# Patient Record
Sex: Female | Born: 1970 | Hispanic: No | Marital: Single
Health system: Northeastern US, Academic
[De-identification: ages and names within clinical notes are randomized; demographics above are authoritative.]

---

## 2016-11-14 ENCOUNTER — Ambulatory Visit

## 2016-12-05 ENCOUNTER — Ambulatory Visit: Admit: 2016-12-05 | Payer: No Typology Code available for payment source

## 2016-12-05 ENCOUNTER — Ambulatory Visit: Admitting: Urology

## 2016-12-05 NOTE — Progress Notes (Signed)
.  Progress Notes  .  Patient: Emily Ford  Provider: [name]    .  DOB: Jan 23, 1971 Age: 46 Y Sex: Female  .  PCP: Adah Salvage MD  Date: 12/05/2016  .  --------------------------------------------------------------------------------  .  REASON FOR APPOINTMENT  .  1. Right xanthogranulomatous pyelonephritis  .  2. Staghorn calculus  .  3. Severe hydronephrosis  .  HISTORY OF PRESENT ILLNESS  .  Adult Urology:   This is a 45 year old female with a complex medical history  including, but not limited to anxiety, depression, arthritis,  back problem, migraine, eating disorder, kidney stones,  hypokalemia, otitis media, acute pharyngitis, disorder of kidney  and ureter, UTIs, dysmenorrhea, contact dermatitis, shoulder  joint pain, neck pain, backache, DSTYK, paresthesia, edema,  dyspnea, abdominal pain, right hydronephrosis, and staghorn  calculus, who is here at the request of Dr. Nestor Lewandowsky  for Dr. Bonne Dolores opinion regarding right xanthogranulomatous  pyelonephritis..Ms. 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 revealed there is relatively  decreased right renal uptake. Right kidney takes up approximately  22.3 % of the administered radiopharmaceutical and left kidney  takes up approximately 77.7 %. Evidence of right renal  obstruction..Ms. Youngman also has a history of a staghorn  calculus. Patient did not bring a disc of the images.She voids  with good stream and good control..She has no other complaints.  Patient denies gross hematuria, urethral discharge, pyuria,  dysuria, UTI, fever, chills, night sweats, weight loss and  skeletal pain..  .  CURRENT MEDICATIONS  .  Taking Alprazolam 2 MG Tablet 1 tablet Orally Twice a day  Taking Cephalexin 500 mg Tablet 2 tablet Orally every 12 hrs  Taking Ferrous Sulfate 325 (65 Fe) MG  Tablet 1 tablet Orally Once  a day  Taking Folic Acid 1 MG Tablet 1 tablet Orally Once a day  Taking Gabapentin 400 MG Capsule 1 capsule Orally Twice a day  Taking Milk of Magnesia 400 MG/5ML Suspension 5 ml as needed  Orally Four times a day  Taking Narcan 4 MG/0.1ML Liquid Nasally  Taking Ocean Nasal Spray 0.65 % Solution 2 sprays in each nostril  as needed Nasally every 2 hrs  Taking Pantoprazole Sodium 40 MG Tablet Delayed Release 1 tablet  Orally Once a day  Taking Paroxetine HCl 30 MG Tablet 1 tablet in the morning Orally  Once a day  Taking Polyethylene Glycol 3350 - Powder  Taking Senna 8.6 MG Tablet 2 tablets at bedtime as needed Orally  Once a day  Taking Tamsulosin HCl 0.4 MG Capsule 1 capsule Orally Once a day  Taking Trazodone HCl 50 MG Tablet 1 tablet at bedtime as needed  Orally Once a day  Taking Tums (calcium carbonate) 200 MG twice daily  Medication List reviewed and reconciled with the patient  .  PAST MEDICAL HISTORY  .  Anxiety  Depression  Arthritis  Back problem  Migraine  Eating disorder  Kidney stones  Hypokalemia  Otitis media  Acute pharyngitis  Disorder of kidney and ureter  Urinary tract infections (UTIs)  Dysmenorrhea  Contact dermatitis  Shoulder joint pain  Neck pain  Backache  DSTYK  Parethesia  Edema  Dyspnea  Abdominal pain  Pyelonephritis  Right hydronephrosis  Staghorn calculus  Sepsis  .  ALLERGIES  .  Toradol  .  SURGICAL HISTORY  .  Appendectomy 1986  Casearean section 1989  Ectopic pregnancy surgery 2001  .  FAMILY HISTORY  .  Mother: deceased  Father: alive, diagnosed with Heart Disease  2 brother(s) , 2 sister(s) . 2 son(s) , 4 daughter(s) .  Mother died of scolerosis of the liver in 47. Diabetes paternal  grandmother.  .  SOCIAL HISTORY  .  .  Tobaccohistory:Currently smoking Pack Year History: Started at 13  years until 1/2 pack per year .  [name]  Caffeine: 4 cups of coffee.  .  Lives with: 6 kids and grandchildren.  [name]  HOSPITALIZATION/MAJOR DIAGNOSTIC PROCEDURE  .  Sepsis  ( August-November-December- February) 2017-2018  .  REVIEW OF SYSTEMS  .  Urology ROS:  .  Constitutional:    No fevers/chills/weight loss or general  weakness . Eyes:    No decreased visual acuity, loss of vision or  diplopia . Integumentary:    No rash or skin lesions, No changes  in hair or nail character . Lungs:    no shortness of breath, new  frequent cough, no history of asthma . Cardiovascular:    No  chest pain, palpitations . Gastrointestinal:    No abdominal  pain/nausea/vomiting/bowel changes . Genitourinary:    No  urethral discharge or dysuria . Musculoskeletal:    No  joint\muscle pain, decreased mobility or joint swelling .  Neurological:    No headache, dizziness, seizures, light  headedness, memory loss or numbness . Psychiatry:    No  mood/behavioral changes, anxiety or depression . Endocrine:    No  hirsutism or excessive hair loss, polyuria, polydipsia or  alopecia . Hematologic/Lymphatic:    No lymphadenopathy, easy  bruising or abnormal bleeding . Allergic/Immunologic:    No  environmental or food allergies .  [name]  VITAL SIGNS  .  Pain scale 8, Ht-in 61.50, Wt-lbs 154.6, BMI 28.74, BP 135/78, HR  120, BSA 1.74, Ht-cm 156.21, Wt-kg 70.13.  [name]  PHYSICAL EXAMINATION  .  Urology PE:  General Appearance:  well-developed, well nourished, NAD, normal  secondary sexual characteristics. HEENT:  normocephalic,  atraumatic, PERRLA, EOMI, anicteric, no nasal discharge, sinuses  non-tender, oral pharynx within normal limits. Skin:  no bruises,  no petechiae, no rashes or lesions. Neck:  supple, no masses,  trachea in midline, normal range of motion. Chest  normal AP  diameter, no rib tenderness. Lungs:  normal respirations,  symmetric excursion with no accesory muscle use. Back:  no CVAT,  vertebral column aligned, no sacral  or dimples, no  scoliosis/kyphosis, masses or tenderness. Cardiovascular:  RRR,  no murmur, pulses intact. Abdomen:  Bruised abdominal wall, Right  anterior abdominal wall with  previous abscess drainage sites  dressings dry and clean, Abdomen globular obese, soft, benign,  non-tender, non-distended, no palpable masses, no  hepato-splenomegaly, no hernias, bladder not palpable.  Extremities:  no clubbing, cyanosis or pitting edema.  Musculo-Skeletal:  no muscle wasting, joint swelling or  tenderness. Lymphatic:  no cervical, axillary or inguinal  adenopathy. Neurologic:  oriented x3, no focal deficits.  GU Female:  Genitalia:  Normally developed female genitalia. Pelvic exam:   Deferred as per patient request.  .  ASSESSMENTS  .  Staghorn calculus - N20.0 (Primary), Long discussion regarding  management of Staghorn calculus  .  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  Hydronephrosis, right - N13.30, Secondary to Staghorn calculus  .  TREATMENT  .  Staghorn calculus  Notes: Advised to bring all discs of previous radiographic  studies from OSH and other medical records that are missing in  order to provide complete assessment of her condition.  .  .  Emphysematous pyelonephritis of right kidney  LAB: Urine Culture  Urine Culture     See Below For Report     ( - )  .  [name]  LAB: Urine Dip POC  1 Blood, Negative for LE, and NIT.  Notes: Long discussion regarding plan of management.  .  .  Others  Notes: RTC 12-19-2016 with discs of all previous radiographic  studies for coordination of care.Due to her complex medical  history as detailed above, Dr Dorothea Glassman spent 55 minutes with  patient reviewing her medical history and for management,  treatment, counselling, and coordination of care for her  condition. 50% of this time was counselling the patient.  [name]  PREVENTIVE MEDICINE  .  Counseling:  Smoking   . BMI Management   .  [name]  PROCEDURE CODES  .  7532 URO MD URINALYSIS AUTO W/O MICROSCOPY  .  FOLLOW UP  .  RTC 12-19-2016.  [name]  Electronically signed by [name] , MD on  12/13/2016 at 09:33 AM EDT  .  Document electronically signed by  [name]    .

## 2016-12-05 NOTE — Progress Notes (Signed)
* * *        Emily Ford**    --- ---    79 Y old Female, DOB: 21-Feb-1971, External MRN: [id number]    Account Number: [id number]    [address], [place], [place]    Home: [phone]    Insurance: [id number] [employer] [facility]    PCP: Emily Salvage, MD Referring: Emily Salvage, MD    Appointment Facility: [facility]        * * *    12/05/2016  Progress Notes: Emily Salvo, MD **CHN#:** [id number]    --- ---    ---        Reason for Appointment    ---      1\. Right xanthogranulomatous pyelonephritis    ---    2\. Staghorn calculus    ---    3\. Severe hydronephrosis    ---      History of Present Illness    ---     _Adult Urology_ :    This is a 46 year old female with a complex medical history including, but not  limited to anxiety, depression, arthritis, back problem, migraine, eating  disorder, kidney stones, hypokalemia, otitis media, acute pharyngitis,  disorder of kidney and ureter, UTIs, dysmenorrhea, contact dermatitis,  shoulder joint pain, neck pain, backache, DSTYK, paresthesia, edema, dyspnea,  abdominal pain, right hydronephrosis, and staghorn calculus, who is here at  the request of Emily Ford for Emily Ford opinion regarding  right xanthogranulomatous pyelonephritis.    .    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    She also has a history of right hydronephrosis.    [name]    NM Kidney in 03/2016 (Report Only) reviewed by [name] revealed there is  relatively decreased right renal uptake. Right kidney takes up approximately  22.3 % of the administered radiopharmaceutical and left kidney takes up  approximately 77.7 %. Evidence of right renal obstruction.    .    Emily Ford also has a history of a staghorn calculus. Patient did not bring a  disc of the images    .    She voids with good stream and good control.    .    She has no other complaints. Patient denies gross hematuria,  urethral  discharge, pyuria, dysuria, UTI, fever, chills, night sweats, weight loss and  skeletal pain.    .      Current Medications    ---    Taking     * Alprazolam 2 MG Tablet 1 tablet Orally Twice a day    ---    * Cephalexin 500 mg Tablet 2 tablet Orally every 12 hrs    ---    * Ferrous Sulfate 325 (65 Fe) MG Tablet 1 tablet Orally Once a day    ---    * Folic Acid 1 MG Tablet 1 tablet Orally Once a day    ---    * Gabapentin 400 MG Capsule 1 capsule Orally Twice a day    ---    * Milk of Magnesia 400 MG/5ML Suspension 5 ml as needed Orally Four times a day    ---    * Narcan 4 MG/0.1ML Liquid Nasally     ---    * Ocean Nasal Spray 0.65 % Solution 2 sprays in each nostril as needed Nasally every 2 hrs    ---    *  Pantoprazole Sodium 40 MG Tablet Delayed Release 1 tablet Orally Once a day    ---    * Paroxetine HCl 30 MG Tablet 1 tablet in the morning Orally Once a day    ---    * Polyethylene Glycol 3350 - Powder     ---    * Senna 8.6 MG Tablet 2 tablets at bedtime as needed Orally Once a day    ---    * Tamsulosin HCl 0.4 MG Capsule 1 capsule Orally Once a day    ---    * Trazodone HCl 50 MG Tablet 1 tablet at bedtime as needed Orally Once a day    ---    * Tums (calcium carbonate) 200 MG twice daily    ---    * Medication List reviewed and reconciled with the patient    ---      Past Medical History    ---       Anxiety.        ---    Depression.        ---    Arthritis.        ---    Back problem.        ---    Migraine.        ---    Eating disorder.        ---    Kidney stones.        ---    Hypokalemia.        ---    Otitis media.        ---    Acute pharyngitis.        ---    Disorder of kidney and ureter.        ---    Urinary tract infections (UTIs).        ---    Dysmenorrhea.        ---    Contact dermatitis.        ---    Shoulder joint pain.        ---    Neck pain.        ---    Backache.        ---    DSTYK.        ---    Parethesia.        ---    Edema.        ---    Dyspnea.        ---     Abdominal pain.        ---    Pyelonephritis.        ---    Right hydronephrosis.        ---    Staghorn calculus.        ---    Sepsis .        ---      Surgical History    ---      Appendectomy 1986    ---    Casearean section 1989    ---    Ectopic pregnancy surgery 2001    ---      Family History    ---      Mother: deceased    ---    Father: alive, diagnosed with Heart Disease    ---    2 brother(s) , 2 sister(s) . 2 son(s) , 4 daughter(s) .    ---    Mother died of scolerosis  of the liver in 1989. Diabetes paternal grandmother.    ---      Social History    ---    Tobacco  history: Currently smoking Pack Year History: Started at 13 years  until 1/2 pack per year .    Caffeine: 4 cups of coffee.    Lives with: 6 kids and grandchildren.      Allergies    ---      Toradol    ---      Hospitalization/Major Diagnostic Procedure    ---      Sepsis ( August-November-December- February) 2017-2018    ---      Review of Systems    ---     _Urology ROS_ :    Constitutional: No fevers/chills/weight loss or general weakness. Eyes: No  decreased visual acuity, loss of vision or diplopia. Integumentary: No rash or  skin lesions, No changes in hair or nail character. Lungs: no shortness of  breath, new frequent cough, no history of asthma. Cardiovascular: No chest  pain, palpitations. Gastrointestinal: No abdominal pain/nausea/vomiting/bowel  changes. Genitourinary: No urethral discharge or dysuria. Musculoskeletal: No  joint\muscle pain, decreased mobility or joint swelling. Neurological: No  headache, dizziness, seizures, light headedness, memory loss or numbness.  Psychiatry: No mood/behavioral changes, anxiety or depression. Endocrine: No  hirsutism or excessive hair loss, polyuria, polydipsia or alopecia.  Hematologic/Lymphatic: No lymphadenopathy, easy bruising or abnormal bleeding.  Allergic/Immunologic: No environmental or food allergies.          Vital Signs    ---    Pain scale 8, Ht-in 61.50, Wt-lbs 154.6, BMI  28.74, BP 135/78, HR 120, BSA  1.74, Ht-cm 156.21, Wt-kg 70.13.      Physical Examination    ---     _Urology PE_ :    General Appearance: well-developed, well nourished, NAD, normal secondary  sexual characteristics. HEENT: normocephalic, atraumatic, PERRLA, EOMI,  anicteric, no nasal discharge, sinuses non-tender, oral pharynx within normal  limits. Skin: no bruises, no petechiae, no rashes or lesions. Neck: supple, no  masses, trachea in midline, normal range of motion. Chest  normal AP diameter,  no rib tenderness. Lungs: normal respirations, symmetric excursion with no  accesory muscle use. Back: no CVAT, vertebral column aligned, no sacral [place]  or dimples, no scoliosis/kyphosis, masses or tenderness. Cardiovascular: RRR,  no murmur, pulses intact. Abdomen: Bruised abdominal wall, Right anterior  abdominal wall with previous abscess drainage sites dressings dry and clean,  Abdomen globular obese, soft, benign, non-tender, non-distended, no palpable  masses, no hepato-splenomegaly, no hernias, bladder not palpable. Extremities:  no clubbing, cyanosis or pitting edema. Musculo-Skeletal: no muscle wasting,  joint swelling or tenderness. Lymphatic: no cervical, axillary or inguinal  adenopathy. Neurologic: oriented x3, no focal deficits.    _GU Female_ :    Genitalia: Normally developed female genitalia. Pelvic exam: Deferred as per  patient request.          Assessments    ---    1\. Staghorn calculus - N20.0 (Primary), Long discussion regarding management  of Staghorn calculus    ---    2\. 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 Hydronephrosis, right - N13.30, Secondary to Staghorn calculus    ---      Treatment    ---       **1\. Staghorn calculus**    Notes: Advised to bring all discs of previous  radiographic studies from OSH  and other medical records that are missing in order to provide complete  assessment of her condition.     ---        **2\. Emphysematous pyelonephritis of right kidney**    _LAB: Urine Culture_   Urine Culture  See Below For Report     \-    --- --- --- ---    _LAB: Urine Dip POC_ 1 Blood, Negative for LE, and NIT.    Notes: Long discussion regarding plan of management.        **3\. Others**    Notes: RTC 12-19-2016 with discs of all previous radiographic studies for  coordination of care.    Due to her complex medical history as detailed above, Dr Dorothea Glassman spent 55  minutes with patient reviewing her medical history and for management,  treatment, counselling, and coordination of care for her condition. 50% of  this time was counselling the patient.      Preventive Medicine    ---      Counseling: Smoking . BMI Management .    ---      Procedure Codes    ---      1610 URO MD URINALYSIS AUTO W/O MICROSCOPY    ---      Follow Up    ---    RTC 12-19-2016.    Electronically signed by Emily Ford , MD on 12/13/2016 at 09:33 AM EDT    Sign off status: Completed        * * *        [facility]    [address]    [place], [place] [address]    Tel: [phone]    Fax: [phone]              * * *          Patient: AURORE, REDINGER DOB: Nov 19, 1970 Progress Note: Emily Salvo, MD  12/05/2016    ---    Note generated by eClinicalWorks EMR/PM Software ([email or web address])

## 2016-12-06 LAB — HX MICRO

## 2016-12-26 ENCOUNTER — Ambulatory Visit

## 2017-09-18 ENCOUNTER — Ambulatory Visit

## 2017-10-10 ENCOUNTER — Ambulatory Visit

## 2017-11-06 ENCOUNTER — Ambulatory Visit

## 2017-11-10 ENCOUNTER — Ambulatory Visit: Admit: 2017-11-10 | Payer: No Typology Code available for payment source

## 2017-11-10 ENCOUNTER — Ambulatory Visit: Admitting: Internal Medicine

## 2017-11-10 LAB — HX HEM-ROUTINE
HX HCT: 45.6 % — ABNORMAL HIGH (ref 32.0–45.0)
HX HGB: 14.1 g/dL (ref 11.0–15.0)
HX MCH: 27.4 pg (ref 26.0–34.0)
HX MCHC: 30.9 g/dL — ABNORMAL LOW (ref 32.0–36.0)
HX MCV: 88.5 fL (ref 80.0–98.0)
HX MPV: 10.4 fL (ref 9.1–11.7)
HX NRBC #: 0 10*3/uL
HX NUCLEATED RBC: 0 %
HX PLT: 211 10*3/uL (ref 150–400)
HX RBC BLOOD COUNT: 5.15 M/uL — ABNORMAL HIGH (ref 3.70–5.00)
HX RDW: 17.5 % — ABNORMAL HIGH (ref 11.5–14.5)
HX WBC: 8.6 10*3/uL (ref 4.0–11.0)

## 2017-11-10 LAB — HX CHEM-PANELS
HX ANION GAP: 10 (ref 3–14)
HX BLOOD UREA NITROGEN: 9 mg/dL (ref 6–24)
HX CHLORIDE (CL): 105 meq/L (ref 98–110)
HX CO2: 23 meq/L (ref 20–30)
HX CREATININE (CR): 0.96 mg/dL (ref 0.57–1.30)
HX GFR, AFRICAN AMERICAN: 80 mL/min/{1.73_m2}
HX GFR, NON-AFRICAN AMERICAN: 69 mL/min/{1.73_m2}
HX GLUCOSE: 94 mg/dL (ref 70–139)
HX POTASSIUM (K): 3.9 meq/L (ref 3.6–5.1)
HX SODIUM (NA): 138 meq/L (ref 135–145)

## 2017-11-10 LAB — HX CHEM-OTHER
HX ALBUMIN: 4.3 g/dL (ref 3.4–4.8)
HX CALCIUM (CA): 9.8 mg/dL (ref 8.5–10.5)
HX MAGNESIUM: 2.1 mg/dL (ref 1.6–2.6)
HX PHOSPHORUS: 2.1 mg/dL — ABNORMAL LOW (ref 2.7–4.5)

## 2017-11-10 LAB — HX DIABETES: HX GLUCOSE: 94 mg/dL (ref 70–139)

## 2017-11-10 NOTE — Progress Notes (Signed)
* * *        Emily Ford**    --- ---    47 Y old Female, DOB: 1971/06/03, External MRN: [id number]    Account Number: [id number]    [address], [place], [id number]    Home: [phone]    Insurance: E59 [employer] [facility]    PCP: Adah Salvage, MD Referring: Adah Salvage, MD    Appointment Facility: Nephrology        * * *    11/10/2017   **Appointment Provider:** Atlanta Pelto **CHN#:** [id number]    --- ---      **Supervising Provider:** Prudy Feeler MD    ---         **Reason for Appointment**    ---       1\. NP/AC/EVAL. 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    Patient was referred from [name] for evaluation of renal function given  his tentative plan for nephrectomy. She has been noted by [name] to have  good urinary stream and good control. Patient denies gross hematuria, urethral  discharge, pyuria, dysuria, fever, and chills.    She was between the hospital and rehab starting february 2nd, recenetly  discharged.    PCP: Dr. Pricilla Holm    Urology: recently referred to [name], prior urologist Dr. Richardean Canal    NM Kidney Scan: 03/2016 (Report Only) with right kidney 22.3% uptake and left  kidney with approximately 77.7%.    Labs:    Hgb 12, Cr 0.93-0.98 (09/2017), more recently Cr 0.9.       **Current Medications**    ---    Taking     * Acetaminophen 325 MG Capsule 1 capsule as needed Orally every 4 hrs    ---    * Alprazolam 0.5 MG Tablet 1 tablet Orally PRN TID    ---    * CeFAZolin Sodium-NaCl 1-0.9 GM/10ML Solution Prefilled Syringe as directed Intravenous 2g Q8H    ---    * Coumadin , Notes: INR 2-3    ---    * Ferrous  Sulfate 325 (65 Fe) MG Tablet 1 tablet Orally Once a day    ---    * Gabapentin 400 mg Capsule 2 capsule Orally Twice a day    ---    * Lidocaine 5 % Patch 1 application to affected area as needed Externally Once a day    ---    * Narcan 4 MG/0.1ML Liquid Nasally     ---    * Omeprazole 20 MG Capsule Delayed Release 1 capsule Orally twice daily    ---    * Paroxetine HCl 20 MG Tablet 1 tablet in the morning Orally Once a day    ---    * Percocet 5-325 MG Tablet 1 tablet as needed Orally every 8 hrs PRN    ---    * Polyethylene Glycol 3350 - Powder , Notes: PRN    ---    * Pulmicort 0.5 MG/2ML Suspension 2 ml Inhalation BID    ---    * Senna 8.6 MG Tablet 2 tablets at  bedtime as needed Orally Once a day, Notes: PRN    ---    * Tamsulosin HCl 0.4 MG Capsule 1 capsule Orally Once a day    ---    * trazadone 50 mg 1 oral nightly for sleep    ---    * Tums (calcium carbonate) 200 mg PRN p.o. twice daily    ---    * Valium 5 MG Tablet 1 tablet as needed Orally Q6H    ---    * Ventolin HFA , Notes: PRN SOB    ---    * Zofran 4 MG Tablet as directed Orally Q6H PRN    ---    * Medication List reviewed and reconciled with the patient    ---       **Past Medical History**    ---       Anxiety.        ---    Depression.        ---    Arthritis.        ---    Back problem.        ---    Migraine.        ---    Eating disorder.        ---    Kidney stones.        ---    Hypokalemia.        ---    Otitis media.        ---    Acute pharyngitis.        ---    Disorder of kidney and ureter.        ---    Urinary tract infections (UTIs).        ---    Dysmenorrhea.        ---    Contact dermatitis.        ---    Shoulder joint pain.        ---    Neck pain.        ---    Backache.        ---    DSTYK.        ---    Parethesia.        ---    Edema.        ---    Dyspnea.        ---    Abdominal pain.        ---    Pyelonephritis.        ---    Right hydronephrosis.        ---    Staghorn calculus.        ---    Sepsis .        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        ---       **Surgical History**    ---       Appendectomy 1986    ---    Casearean section 1989    ---    Ectopic pregnancy surgery 2001    ---       **Family History**    ---       Mother: deceased    ---    Father: alive, diagnosed with Heart Disease    ---    2 brother(s) , 2 sister(s) . 2 son(s) , 4 daughter(s) .    ---  Mother died of scolerosis of the liver in 57. Diabetes paternal grandmother.    ---       **Social History**    ---    Tobacco    history: _Currently smoking Pack Year History: Started at 13 years until 1/2  pack per year_    Caffeine: 4 cups of coffee.    Alcohol    _Denies_    Illicit drugs: Never.    Lives with: 6 kids and grandchildren.      **Allergies**    ---       Toradol    ---       **Hospitalization/Major Diagnostic Procedure**    ---       Sepsis ( August-November-December- February) 2017-2018    ---       **Review of Systems**    ---     _Nephrology_ :    CONSTITUTIONAL: Denies unintentional weight change, fatigue, fever, chills. [name]  GASTROINTESTINAL: \+ nausea, no diarrhea. RESPIRATORY: Denies shortness of  breath, cough, + sleep apnea. GENITOURINARY: No pain or buring on urination.  MUSCULOSKELETAL: joint pain, muscle tenderness.          **Vital Signs**    ---    Ht-in 61.50, Wt-lbs 139, BMI 25.84, BP 135/90, BSA 1.65, Ht-cm 156.21, Wt-kg  63.05, Wt Change -15.6 lb.       **Past Orders**    ---     _Lab:Albumin (Order Date - 11/10/2017) (Collection Date - 11/10/2017)_    Albumin  4.3    3.4 - 4.8 - g/dL     _Lab:Magnesium (MG) (Order Date - 11/10/2017) (Collection Date - 11/10/2017)_    Magnesium  2.1    1.6 - 2.6 - mg/dL     _Lab:Calcium (Ca) (Order Date - 11/10/2017) (Collection Date - 11/10/2017)_    Calcium (Ca)  9.8    8.5 - 10.5 - mg/dL     _Lab:Glucose (GLU) (Order Date - 11/10/2017) (Collection Date - 11/10/2017)_    Glucose  94    70 - 139 - mg/dL     _Lab:Phosphorus (PHOS) (Order Date - 11/10/2017) (Collection Date  -  11/10/2017)_    Phosphorus  2.1  L  2.7 - 4.5 - mg/dL     _Lab:Blood Urea Nitrogen (BUN) (Order Date - 11/10/2017) (Collection Date -  11/10/2017)_    Blood Urea Nitrogen  9    6 - 24 - mg/dL     _Lab:CBC/NO DIF (CBCND) (Order Date - 11/10/2017) (Collection Date -  11/10/2017)_    WBC  8.6    4.0 - 11.0 - K/uL    RBC  5.15  H  3.70 - 5.00 - M/uL    HGB  14.1    11.0 - 15.0 - g/dL    HCT  54.0  H  98.1 - 45.0 - %    MCV  88.5    80.0 - 98.0 - fL    MCH  27.4    26.0 - 34.0 - pg    MCHC  30.9  L  32.0 - 36.0 - g/dL    RDW  19.1  H  47.8 - 14.5 - %    PLT  211    150 - 400 - K/uL    MPV  10.4    9.1 - 11.7 - fL    N RBC  0     \- %    N RBC #  0.0    <0.0 - K/uL  _Lab:Electrolytes (Na, K, Cl, CO2) LYTES (Order Date - 11/10/2017)  (Collection Date - 11/10/2017)_    ANION GAP  10    3 - 14 -    CL  105    98 - 110 - mEq/L    CO2  23    20 - 30 - mEq/L    K  3.9    3.6 - 5.1 - mEq/L    NA  138    135 - 145 - mEq/L     _Lab:Creatinine (CR) (Order Date - 11/10/2017) (Collection Date -  11/10/2017)_    Creatinine (CR)  0.96    0.57 - 1.30 - mg/dL     _Lab:GFR, NAA (Order Date - 11/10/2017) (Collection Date - 11/10/2017)_    GFR, NAA  69    >60 - mL/min/1.74m2       **Physical Examination**    ---    General: labile affect, comfortable    CV: RRR, no murmurs    Resp: mild scattered rhonchi    Abd: right flank with two tubes, dressing edge hanging off with mild erythema  at entry site, bags draining purpurlent material, abdomen distended, no  tenderness at CV angle and anterior abdomen    Ext: no edema.       **Assessments**    ---    1\. 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 Xanthogranulomatous pyelonephritis**    Notes: Alexi's eGFR is currently 106 and based on the prior nuclear  medicine scan, she will have a 25% reduction in her GFR to 50-55 if she  chooses to go forth with the nephroectomy. There is no contraindication to a  nephrectomy seeing as her  left kidney, to our knowledge, is otherwise healthy.  We will set her up with a nuclear medicine scan for re-assessment of split  renal function and a follow up with [name].    ---        **Labs**    --- ---    _Lab: [facility] Urinalysis_    ---       SG  1.010       --- --- --- ---    pH  6       --- --- --- ---    LEU  +2       --- --- --- ---    NIT  neg       --- --- --- ---    PRO  +2       --- --- --- ---    GLU  norm       --- --- --- ---    KET  neg       --- --- --- ---    UBG  norm       --- --- --- ---    BIL  neg       --- --- --- ---    BLD  +1       --- --- --- ---    **Appointment Provider:** Brae Gartman    Electronically signed by Prudy Feeler MD on 11/17/2017 at 08:01 PM EDT    Sign off status: Completed        * * *        Nephrology    [address]    [address] 4th floor    [place], [place] [address]  Tel: [phone]    Fax: [phone]              * * *          Patient: Emily Ford DOB: 1971-08-15 Progress Note: Ephraim Reichel 11/10/2017    ---    Note generated by eClinicalWorks EMR/PM Software ([email or web address])

## 2017-11-10 NOTE — Progress Notes (Signed)
.  Progress Notes  .  Patient: Emily Ford  Provider: Hinton Lovely  MD  .  DOB: [birth year] Age: 47 Y Sex: Female  Supervising Provider:: Prudy Feeler MD  Date: 11/10/2017  .  PCP: Adah Salvage MD  Date: 11/10/2017  .  --------------------------------------------------------------------------------  .  REASON FOR APPOINTMENT  .  1. NP/AC/EVAL. NEPHRECTOMY/WRIGHT/RES  .  HISTORY OF PRESENT ILLNESS  .  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  Dorothea Glassman for evaluation of renal function given his tentative plan  for nephrectomy. She has been noted by [name] to have good  urinary stream and good control. Patient denies gross hematuria,  urethral discharge, pyuria, dysuria, fever, and chills.She was  between the hospital and rehab starting february 2nd, recenetly  discharged.PCP: Dr. Payton Spark KapogiannisforUrology: recently  referred to [name], prior urologist Dr. Lenn Sink Kidney  Scan: 03/2016 (Report Only) with right kidney 22.3% uptake and  left kidney with approximately 77.7%.Labs:Hgb 12, Cr 0.93-0.98  (09/2017), more recently Cr 0.9.  [name]  CURRENT MEDICATIONS  .  Taking Acetaminophen 325 MG Capsule 1 capsule as needed Orally  every 4 hrs  Taking Alprazolam 0.5 MG Tablet 1 tablet Orally PRN TID  Taking CeFAZolin Sodium-NaCl 1-0.9 GM/10ML Solution Prefilled  Syringe as directed Intravenous 2g Q8H  Taking Coumadin , Notes: INR 2-3  Taking Ferrous Sulfate 325 (65 Fe) MG Tablet 1 tablet Orally Once  a day  Taking Gabapentin 400 mg Capsule 2 capsule Orally Twice a day  Taking Lidocaine 5 % Patch 1 application to affected area as  needed Externally Once a day  Taking Narcan 4 MG/0.1ML Liquid Nasally  Taking Omeprazole 20 MG Capsule  Delayed Release 1 capsule Orally  twice daily  Taking Paroxetine HCl 20 MG Tablet 1 tablet in the morning Orally  Once a day  Taking Percocet 5-325 MG Tablet 1 tablet as needed Orally every 8  hrs PRN  Taking Polyethylene Glycol 3350 - Powder , Notes: PRN  Taking Pulmicort 0.5 MG/2ML Suspension 2 ml Inhalation BID  Taking Senna 8.6 MG Tablet 2 tablets at bedtime as needed Orally  Once a day, Notes: PRN  Taking Tamsulosin HCl 0.4 MG Capsule 1 capsule Orally Once a day  Taking trazadone 50 mg 1 oral nightly for sleep  Taking Tums (calcium carbonate) 200 mg PRN p.o. twice daily  Taking Valium 5 MG Tablet 1 tablet as needed Orally Q6H  Taking Ventolin HFA , Notes: PRN SOB  Taking Zofran 4 MG Tablet as directed Orally Q6H PRN  Medication List reviewed and reconciled with the patient  .  PAST MEDICAL HISTORY  .  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  ALLERGIES  .  Toradol  .  SURGICAL HISTORY  .  Appendectomy 1986  Casearean section 1989  Ectopic pregnancy surgery 2001  .  FAMILY  HISTORY  .  Mother: deceased  Father: alive, diagnosed with Heart Disease  2 brother(s) , 2 sister(s) . 2 son(s) , 4 daughter(s) .  Mother died of scolerosis of the liver in 40. Diabetes paternal  grandmother.  .  SOCIAL HISTORY  .  .  Tobacco  history:Currently smoking Pack Year History: Started at 13 years  until 1/2 pack per year  .  [name]  Caffeine: 4 cups of coffee.  .  .  Alcohol  Denies  .  [name]  Illicit drugs: Never.  .  .  Lives with: 6 kids and grandchildren.  [name]  HOSPITALIZATION/MAJOR DIAGNOSTIC PROCEDURE  .  Sepsis ( August-November-December-  February) 2017-2018  .  REVIEW OF SYSTEMS  .  Nephrology:  .  CONSTITUTIONAL:    Denies unintentional weight change, fatigue,  fever, chills.  [name] GASTROINTESTINAL:    + nausea, no diarrhea .  RESPIRATORY:    Denies shortness of breath, cough, + sleep apnea  . GENITOURINARY:    No pain or buring on urination .  MUSCULOSKELETAL:    joint pain, muscle tenderness .  [name]  VITAL SIGNS  .  Ht-in 61.50, Wt-lbs 139, BMI 25.84, BP 135/90, BSA 1.65, Ht-cm  156.21, Wt-kg 63.05, Wt Change -15.6 lb.  [name]  PAST ORDERS  .  Lab:Albumin (Order Date - 11/10/2017) (Collection Date -  11/10/2017)  Albumin  4.3  3.4 - 4.8 - g/dL  .  ZOX:WRUEAVWUJ (MG) (Order Date - 11/10/2017) (Collection Date -  11/10/2017)  Magnesium  2.1  1.6 - 2.6 - mg/dL  .  WJX:BJYNWGN (Ca) (Order Date - 11/10/2017) (Collection Date -  11/10/2017)  Calcium (Ca)  9.8  8.5 - 10.5 - mg/dL  .  FAO:ZHYQMVH (GLU) (Order Date - 11/10/2017) (Collection Date -  11/10/2017)  Glucose  94  70 - 139 - mg/dL  .  QIO:NGEXBMWUXL (PHOS) (Order Date - 11/10/2017) (Collection Date  - 11/10/2017)  Phosphorus  2.1  2.7 - 4.5 - mg/dL  .  KGM:WNUUV Urea Nitrogen (BUN) (Order Date - 11/10/2017)  (Collection Date - 11/10/2017)  Blood Urea Nitrogen  9  6 - 24 -  mg/dL  .  Lab:CBC/NO DIF (CBCND) (Order Date - 11/10/2017) (Collection Date  - 11/10/2017)  WBC  8.6  4.0 - 11.0 - K/uL  .    RBC  5.15  3.70 - 5.00 - M/uL  .    HGB  14.1  11.0 - 15.0 - g/dL  .    HCT  45.6  32.0 - 45.0 - %  .    MCV  88.5  80.0 - 98.0 - fL  .    MCH  27.4  26.0 - 34.0 - pg  .    MCHC  30.9  32.0 - 36.0 - g/dL  .    RDW  17.5  11.5 - 14.5 - %  .    PLT  211  150 - 400 - K/uL  .    MPV  10.4  9.1 - 11.7 - fL  .    N RBC  0  - %  .    N RBC #  0.0  <0.0 - K/uL  .  OZD:GUYQIHKVQQVZ (Na, K, Cl, CO2) LYTES (Order Date - 11/10/2017)  (Collection Date - 11/10/2017)  ANION GAP  10  3 - 14 -  .    CL  105  98 - 110 - mEq/L  .    CO2  23  20 - 30 - mEq/L  .    K  3.9  3.6 - 5.1 - mEq/L  .    NA  138  135 - 145 -  mEq/L  .  ZOX:WRUEAVWUJW (CR) (Order Date - 11/10/2017) (Collection Date -  11/10/2017)  Creatinine (CR)  0.96  0.57 - 1.30 - mg/dL  .  Lab:GFR, NAA (Order Date - 11/10/2017) (Collection Date -  11/10/2017)  GFR, NAA  69  >60 - mL/min/1.29m2  .  PHYSICAL EXAMINATION  .  General: labile affect, comfortableCV: RRR, no murmursResp: mild  scattered rhonchiAbd: right flank with two tubes, dressing edge  hanging off with mild erythema at entry site, bags draining  purpurlent material, abdomen distended, no tenderness at CV angle  and anterior abdomenExt: no edema.  .  ASSESSMENTS  .  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  TREATMENT  .  Xanthogranulomatous pyelonephritis  Notes: Emily Ford's eGFR is currently 85 and based on the prior  nuclear medicine scan, she will have a 25% reduction in her GFR  to 50-55 if she chooses to go forth with the nephroectomy. There  is no contraindication to a nephrectomy seeing as her left  kidney, to our knowledge, is otherwise healthy. We will set her  up with a nuclear medicine scan for re-assessment of split renal  function and a follow up with [name].  [name]  LABS  .   LAB: [facility] Urinalysis  .     SG     1.010     ()     pH     6     ()     LEU     +2     ()     NIT     neg     ()     PRO     +2     ()     GLU     norm     ()     KET     neg     ()     UBG     norm     ()     BIL     neg     ()     BLD     +1     ()  .  .  .  Appointment Provider: Jillyn Hidden HO  .  Electronically signed by Prudy Feeler MD on  11/17/2017 at 08:01 PM EDT  .  CONFIRMATORY SIGN OFF  HO,GARY , MD 11/10/2017 6:11:18 PM > ATTENDING ATTESTATION : I personally interviewed and examined the patient and both Dr. Anselm Jungling and I contributed to this electronic note. I agree with the history, exam, assessment and plan as detailed in the note and edited it as necessary. ATTENDING NOTE: 47 year old woman with complicated medical history including known staghorn calculus leading to need for nephrostomy tubes; prior  nuclear scan with 23% split function in the affected kidney. We discussed this a relatively modest reduction in her total GFR, in fact a greater threat to her continued bilateral kidney function is to continue to become ill from recurrent infections and obstruction. She is quite emotional about this whole arrangement I think she will need maximum emotional support during this process. Accompanied by her daughter who I think will be a great help. We discussed the basics of protection of remaining residual kidney function, including blood pressure control, avoidance of NSAIDs, and avoidance of diabetes all of  which she understands and should be able to achieve. As Dr. Anselm Jungling notes, will get another nuclear scan to best assess split function so we can give her the best possible current advice regarding post-operative kidney function. To see [name] again for final discussion regarding nephrectomy. Further comments per Dr. Anselm Jungling, above.  .  Document electronically signed by HO, GARY  MD  .

## 2017-11-19 ENCOUNTER — Ambulatory Visit: Admitting: Internal Medicine

## 2017-11-19 NOTE — Progress Notes (Signed)
* * *        **  Emily Ford**    --- ---    60 Y old Female, DOB: 1971/05/14    [address] [name] [place], [place] [address]    Home: [phone]    Provider: Prudy Feeler, MD        * * *    Telephone Encounter    ---    Answered by   Jessie Foot  Date: 11/19/2017         Time: 01:32 PM    Caller   [name] at [facility]    --- ---            Reason   IV antibiotics            Message                      Good Afternoon,            Pt IV antibiotics last dose is tomorrow night. They need to know if pt will continue IV antibiotics delivery or will discontinue IV line. Best number for [name] is [phone].            Thank you.                      Action Taken                      Lettsome,Paulette  11/19/2017 1:34:41 PM >       DIBENEDETTO,CHRISTINE M, PA-C 11/19/2017 1:51:21 PM > Patient is not yet under my care,  forwarding message to attending, Dr. Delford Field                    * * *                ---          * * *          Patient: Emily Ford DOB: May 25, 1971 Provider: Prudy Feeler, MD  11/19/2017    ---    Note generated by eClinicalWorks EMR/PM Software ([email or web address])

## 2017-11-27 ENCOUNTER — Ambulatory Visit: Admitting: Internal Medicine

## 2017-11-27 NOTE — Progress Notes (Signed)
* * *        **  Emily Ford**    --- ---    82 Y old Female, DOB: 09-20-1970    [address] [name], [place] [address]    Home: [phone]    Provider: Prudy Feeler, MD        * * *    Telephone Encounter    ---    Answered by   Beryle Quant  Date: 11/27/2017         Time: 04:14 PM    Caller   fax in box    --- ---            Reason   Rx refill            Message                      Document attached from fax inbox.                Action Taken                      Tam,Finna  11/27/2017 4:18:24 PM > Please refill the following prescription.      SW: Not a prescription/course of treatment appropriate for me to refill or manage.  Have left msg with [facility].                     * * *                ---          * * *          Patient: Emily Ford DOB: [birth year] Provider: Prudy Feeler, MD  11/27/2017    ---    Note generated by eClinicalWorks EMR/PM Software ([email or web address])

## 2017-11-27 NOTE — Progress Notes (Signed)
* * *        **  Emily Ford**    --- ---    72 Y old Female, DOB: 02/17/1971    [address] [name] [place], [place] [address]    Home: [phone]    Provider: Prudy Feeler, MD        * * *    Telephone Encounter    ---    Answered by   Prudy Feeler  Date: 11/27/2017         Time: 05:10 PM    Message                      Received faxes re. abx and PICC care.   Appears I am listed as the responsible provider.   Unfortunately patient is new to me and I don't have records of data including imaging, decisionmaking process, or even the organism being treated.  In addition, nephrology is usually not an appropriate decisionmaking specialty re. infection of kidney.    At first fax, asked home care company to seek information from original prescriber; with second fax I am reaching out & left message to listed PCP (who appears to be a pulmonologist) so I can get enough information to assist.   May well be that PICC can come out and abx stop, but need more information to make this decisino.         --- ---                * * *                ---          * * *          Patient: Emily Ford DOB: 12-Jun-1971 Provider: Prudy Feeler, MD  11/27/2017    ---    Note generated by eClinicalWorks EMR/PM Software ([email or web address])

## 2017-11-27 NOTE — Progress Notes (Signed)
* * *        **  Emily Ford**    --- ---    47 Y old Female, DOB: March 22, 1971    [address] [name] [place], [place] [address]    Home: [phone]    Provider: Prudy Feeler, MD        * * *    Telephone Encounter    ---    Answered by   Jessie Foot  Date: 11/27/2017         Time: 03:56 PM    Caller   patient    --- ---            Reason   nuclear test on 4/8            Message                      Hello,            Pt states she has a nuclear test/kidney function test in the afternoon at 1pm and would like to know if it's in the same building as her morning appt. IN addition how long will the test take as she has to schedule her ride for pick up. Best number for pt is [phone].            Thank you.                Action Taken                      Lettsome,Paulette  11/27/2017 3:10:50 PM >       Cole,Ashley  11/27/2017 3:56:07 PM > PT called back in regards to this testing please reach out to the PT as soon as possible in regards to this matter. This information needs to be relayed to the PT so that she can best set up her transportation with the [facility]. Best call back # [phone]- Thank you , Morrie Sheldon                     * * *                ---          * * *          Patient: Emily Ford DOB: 1971-08-23 Provider: Prudy Feeler, MD  11/27/2017    ---    Note generated by eClinicalWorks EMR/PM Software ([email or web address])

## 2017-11-28 ENCOUNTER — Ambulatory Visit: Admitting: Internal Medicine

## 2017-11-28 NOTE — Progress Notes (Signed)
* * *        **  Emily Ford**    --- ---    66 Y old Female, DOB: 1971/07/22    [address] [name] [place], [place] [address]    Home: [phone]    Provider: Prudy Feeler, MD        * * *    Telephone Encounter    ---    Answered by   Jessie Foot  Date: 11/28/2017         Time: 03:30 PM    Caller   Scott with [facility]    --- ---            Reason   IV CeFAZolin            Message                      Hello,            Pt only has does left through Sunday night, need to know if pt will continue IV CeFAZolin or should delivery be discontinued. Best number is [phone].            Thank you,                Action Taken                      [facility]  11/28/2017 3:32:54 PM >       DIBENEDETTO,CHRISTINE M, PA-C 12/01/2017 8:32:24 AM > Pennie Rushing, I really don't know why these messages keep coming to [place]. Do you know who prescribed her IV antibiotics and who is managing them? I am happy to call her back to discuss.  Maybe we can talk about it in clinic.      Thanks!      Christine            Again, I have left message with the [facility] compant stating we do not have the appropriate information, nor are we the proper service, to make this important medical decision.   Would be happy to help sort out proper contact, or to gain information so that we can help.                     * * *                ---          * * *          Patient: Emily Ford DOB: 1971-02-19 Provider: Prudy Feeler, MD  11/28/2017    ---    Note generated by eClinicalWorks EMR/PM Software ([email or web address])

## 2017-12-01 ENCOUNTER — Ambulatory Visit: Admitting: Internal Medicine

## 2017-12-01 NOTE — Progress Notes (Signed)
* * *        **  Emily Ford**    --- ---    59 Y old Female, DOB: [birth year]    [address] [name] [place], [place] [address]    Home: [phone]    Provider: Prudy Feeler, MD        * * *    Telephone Encounter    ---    Answered by   Felix Pacini  Date: 12/01/2017         Time: 01:57 PM    Caller   [facility]    --- ---            Reason   call back request            Message                      Hello,      the pt clinic from [facility] called in regards to a voicemail left for them.they would like to confirm the line can be taken out of the pt .please give them a cal back at [phone].            Thank you                 Action Taken                      [place]  12/01/2017 1:59:07 PM >       DIBENEDETTO,CHRISTINE M, PA-C 12/01/2017 2:40:33 PM > Pennie Rushing, did you call?      I called repeatedly and left message stating we are not appropriate person, nor do we have the information, to help sort out this medical decision.  Would be happy to talk to someone to help sort it out.                     * * *                ---          * * *          Patient: Emily Ford DOB: [birth year] Provider: Prudy Feeler, MD  12/01/2017    ---    Note generated by eClinicalWorks EMR/PM Software ([email or web address])

## 2018-01-06 ENCOUNTER — Ambulatory Visit

## 2018-01-26 ENCOUNTER — Ambulatory Visit: Admit: 2018-01-26 | Payer: No Typology Code available for payment source

## 2018-01-26 ENCOUNTER — Ambulatory Visit: Admitting: Internal Medicine

## 2018-01-26 ENCOUNTER — Ambulatory Visit: Admitting: Urology

## 2018-01-26 NOTE — Progress Notes (Signed)
* * *        Emily Ford**    --- ---    15 Y old Female, DOB: 1971-01-26, External MRN: [id number]    Account Number: [id number]    [address], [place], [id number]    Home: [phone]    Insurance: [id number] [facility]    PCP: Adah Salvage, MD Referring: Prudy Feeler    Appointment Facility: [facility]        * * *    01/26/2018  Progress Notes: [name], MD **CHN#:** [id number]    --- ---    ---         **Reason for Appointment**    ---       1\. Right xanthogranulomatous pyelonephritis    ---    2\. Staghorn calculus    ---    3\. Severe hydronephrosis    ---    4\. Inguinal pain    ---       **History of Present Illness**    ---     _Adult Urology_ :    This is a 47 year old female with a complex medical history including, but not  limited to anxiety, depression, arthritis, back problem, migraine, eating  disorder, kidney stones, hypokalemia, otitis media, acute pharyngitis,  disorder of kidney and ureter, UTIs, dysmenorrhea, contact dermatitis,  shoulder joint pain, neck pain, backache, DSTYK, paresthesia, edema, dyspnea,  abdominal pain, right hydronephrosis, staghorn calculus, Inguinal pain and  right xanthogranulomatous pyelonephritis, who is here today for a follow-up  visit.    .    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    She also has a history of right hydronephrosis.    [name]    NM Kidney in 03/2016 (Report Only) reviewed by [name] revealed there is  relatively decreased right renal uptake. Right kidney takes up approximately  22.3 % of the administered radiopharmaceutical and left kidney takes up  approximately 77.7 %. Evidence of right renal obstruction.    .    Emily Ford also has a history of a staghorn calculus.    .    Renal-Spect in 01/2018 reviewed by [name] revealed normal renal cortical  imaging of the left kidney, without evidence of perfusion abnormality.    She has a  history of UTI/urosepsis but last epsisode in April 2018, since then  she has been on home IV antibiotics since July 108. He has been sepsis since  that time.    .    BUN, Cr, GFR:    07/2016: 20, 1.07, 68.    08/2016: 12, 1.10, 66.    10/2017: 19, 1.13, 63.    [name]    PSA:    11/2016: 0.69.    [name]    Urine Culture:    07/2016: No Growth.    08/26/2016: <=10,000 CFU/mL Mixed bacterial flora.    09/16/2016: 50,000-99,000 CFU/mLGroup B Streptococcus    04/2017: >=100,000 CFU/mLGroup B Streptococcus    .    She voids with good stream and good control.    .    She has no other complaints. Patient denies gross hematuria, urethral  discharge, pyuria, dysuria, UTI, fever, chills, night sweats, weight loss and  skeletal pain.    .       **Current Medications**    ---    Taking     * CeFAZolin Sodium-NaCl 1-0.9  GM/10ML Solution Prefilled Syringe as directed Intravenous 2g Q8H    ---    * Coumadin , Notes: INR 2-3    ---    * Gabapentin 400 mg Capsule 2 capsule Orally Twice a day    ---    * Lidocaine 5 % Patch 1 application to affected area as needed Externally Once a day    ---    * Omeprazole 20 MG Capsule Delayed Release 1 capsule Orally twice daily    ---    * Paroxetine HCl 20 MG Tablet 1 tablet in the morning Orally Once a day    ---    * Percocet 5-325 MG Tablet 1 tablet as needed Orally every 8 hrs PRN    ---    * Polyethylene Glycol 3350 - Powder , Notes: PRN    ---    * Pulmicort 0.5 MG/2ML Suspension 2 ml Inhalation BID    ---    * Senna 8.6 MG Tablet 2 tablets at bedtime as needed Orally Once a day, Notes: PRN    ---    * Tamsulosin HCl 0.4 MG Capsule 1 capsule Orally Once a day    ---    * trazadone 50 mg 1 oral nightly for sleep    ---    * Tums (calcium carbonate) 200 mg PRN p.o. twice daily    ---    * Ventolin HFA , Notes: PRN SOB    ---    * Zofran 4 MG Tablet as directed Orally Q6H PRN    ---    Not-Taking/PRN    * Acetaminophen 325 MG Capsule 1 capsule as needed Orally every 4 hrs    ---    * Alprazolam 0.5 MG  Tablet 1 tablet Orally PRN TID    ---    * Ferrous Sulfate 325 (65 Fe) MG Tablet 1 tablet Orally Once a day    ---    * Narcan 4 MG/0.1ML Liquid Nasally     ---    * Valium 5 MG Tablet 1 tablet as needed Orally Q6H    ---    * Medication List reviewed and reconciled with the patient    ---       **Past Medical History**    ---       Anxiety.        ---    Depression.        ---    Arthritis.        ---    Back problem.        ---    Migraine.        ---    Eating disorder.        ---    Kidney stones.        ---    Hypokalemia.        ---    Otitis media.        ---    Acute pharyngitis.        ---    Disorder of kidney and ureter.        ---    Urinary tract infections (UTIs).        ---    Dysmenorrhea.        ---    Contact dermatitis.        ---    Shoulder joint pain.        ---    Neck pain.        ---  Backache.        ---    DSTYK.        ---    Parethesia.        ---    Edema.        ---    Dyspnea.        ---    Abdominal pain.        ---    Pyelonephritis.        ---    Right hydronephrosis.        ---    Staghorn calculus.        ---    Sepsis .        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        ---       **Surgical History**    ---       Appendectomy 1986    ---    Casearean section 1989    ---    Ectopic pregnancy surgery 2001    ---       **Family History**    ---       Mother: deceased    ---    Father: alive, diagnosed with Heart Disease    ---    2 brother(s) , 2 sister(s) . 2 son(s) , 4 daughter(s) .    ---    Mother died of scolerosis of the liver in 1989. Diabetes paternal grandmother.    ---       **Social History**    ---    Lives with: 6 kids and grandchildren.    Alcohol  Denies.    Illicit drugs: Never.    Caffeine: 4 cups of coffee.    Tobacco  history: Currently smoking Pack Year History: Started at 13 years  until 1/2 pack per year .      **Allergies**    ---       Toradol    ---       **Hospitalization/Major Diagnostic Procedure**    ---       Sepsis (  August-November-December- February) 2017-2018    ---       **Review of Systems**    ---     _Urology ROS_ :    Constitutional: No fevers/chills/weight loss or general weakness. Eyes: No  decreased visual acuity, loss of vision or diplopia. Integumentary: No rash or  skin lesions, No changes in hair or nail character. Lungs: no shortness of  breath, new frequent cough, no history of asthma. Cardiovascular: No chest  pain, palpitations. Gastrointestinal: No abdominal pain/nausea/vomiting/bowel  changes. Genitourinary: No urethral discharge or dysuria. Musculoskeletal: No  joint\muscle pain, decreased mobility or joint swelling. Neurological: No  headache, dizziness, seizures, light headedness, memory loss or numbness.  Psychiatry: No mood/behavioral changes, anxiety or depression. Endocrine: No  hirsutism or excessive hair loss, polyuria, polydipsia or alopecia.  Hematologic/Lymphatic: No lymphadenopathy, easy bruising or abnormal bleeding.  Allergic/Immunologic: No environmental or food allergies.          **Vital Signs**    ---    Pain scale 8, Ht-in 61.50, Wt-lbs 155.4, BMI 28.88, BP 115/77.       **Physical Examination**    ---     _Urology PE_ :    General Appearance: well-developed, well nourished, NAD, normal secondary  sexual characteristics. HEENT: normocephalic, atraumatic, PERRLA, EOMI,  anicteric, no nasal discharge, sinuses non-tender, oral pharynx within normal  limits. Skin: no bruises, no petechiae,  no rashes or lesions. Neck: supple, no  masses, trachea in midline, normal range of motion. Chest  normal AP diameter,  no rib tenderness. Lungs: normal respirations, symmetric excursion with no  accesory muscle use. Back: no CVAT, vertebral column aligned, no sacral   or dimples, no scoliosis/kyphosis, masses or tenderness. Cardiovascular: RRR,  no murmur, pulses intact. Abdomen: Bruised abdominal wall, Right anterior  abdominal wall with previous abscess drainage sites dressings dry and  clean,  Abdomen globular obese, soft, benign, non-tender, non-distended, no palpable  masses, no hepato-splenomegaly, no hernias, bladder not palpable. Extremities:  no clubbing, cyanosis or pitting edema. Musculo-Skeletal: no muscle wasting,  joint swelling or tenderness. Lymphatic: no cervical, axillary or inguinal  adenopathy. Neurologic: oriented x3, no focal deficits.    _GU Female_ :    Genitalia: Normally developed female genitalia. Pelvic exam: Deferred as per  patient request.          **Assessments**    ---    1\. Staghorn calculus - N20.0 (Primary), Long discussion regarding management  of Staghorn calculus    ---    2\. 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 Hydronephrosis, right - N13.30, Secondary to Staghorn calculus    ---    4\. Inguinal pain, unspecified laterality - R10.30    ---       **Treatment**    ---       **1\. Staghorn calculus**    Notes:  request recent medical records    .    ---        **2\. Emphysematous pyelonephritis of right kidney**    _LAB: Urine Culture (CXURN)_    _LAB: Urine Dip POC_ Negative for LE, NIT, and Blood    Notes:  Long discussion regarding plan of management    will plan to follow up with local urology (Dr. Merilyn Baba) to discuss plan for  surgery.        **3\. Others**    Notes: RTC PRN    Due to the complexity of his condition Dr Dorothea Glassman spent 50 minutes with patient  reviewing his medical history and for management, treatment, counselling, and  coordination of care for his condition. 50% of this time was counselling the  patient.      **Preventive Medicine**    ---       Counseling: Smoking . BMI Management .    ---      **Procedure Codes**    ---       7532 URO MD URINALYSIS AUTO W/O MICROSCOPY    ---       **Follow Up**    ---    RTC with discs of all previous radiographic studies for coordination of care.    Electronically signed by [name] , MD on 03/27/2018 at 06:03 PM EDT     Sign off status: Completed        * * *        [facility]    [address]    [place], [place] [address]    Tel: [phone]    Fax: [phone]              * * *          Patient: Emily Ford, Emily Ford DOB: 05-22-1971 Progress Note: [name], MD  01/26/2018    ---    Note generated  by eClinicalWorks EMR/PM Software ([email or web address])

## 2018-01-26 NOTE — Progress Notes (Signed)
.  Progress Notes  .  Patient: Emily Ford  Provider: [name]    .  DOB: Mar 05, 1971 Age: 47 Y Sex: Female  .  PCP: Adah Salvage MD  Date: 01/26/2018  .  --------------------------------------------------------------------------------  .  REASON FOR APPOINTMENT  .  1. Right xanthogranulomatous pyelonephritis  .  2. Staghorn calculus  .  3. Severe hydronephrosis  .  4. Inguinal pain  .  HISTORY OF PRESENT ILLNESS  .  Adult Urology:   This is a 47 year old female with a complex medical history  including, but not limited to anxiety, depression, arthritis,  back problem, migraine, eating disorder, kidney stones,  hypokalemia, otitis media, acute pharyngitis, disorder of kidney  and ureter, UTIs, dysmenorrhea, contact dermatitis, shoulder  joint pain, neck pain, backache, DSTYK, paresthesia, edema,  dyspnea, abdominal pain, right hydronephrosis, staghorn calculus,  Inguinal pain and right xanthogranulomatous pyelonephritis, who  is here today for a follow-up visit..Ms. Wessling has a history of  chronic right xanthogranulomatous pyelonephritis with recurrent  sepsis.She also has a history of right hydronephrosis.Marland KitchenNM Kidney  in 03/2016 (Report Only) reviewed by [name] revealed there is  relatively decreased right renal uptake. Right kidney takes up  approximately 22.3 % of the administered radiopharmaceutical and  left kidney takes up approximately 77.7 %. Evidence of right  renal obstruction..Ms. Bloodgood also has a history of a staghorn  calculus. .Renal-Spect in 01/2018 reviewed by [name] revealed  normal renal cortical imaging of the left kidney, without  evidence of perfusion abnormality.She has a history of  UTI/urosepsis but last epsisode in April 2018, since then she has  been on home IV antibiotics since July 108. He has been sepsis  since that time. .BUN, Cr, GFR:07/2016: 20, 1.07, 68.08/2016: 12,  1.10, 66.10/2017: 19, 1.13, 63..PSA:11/2016: 0.69.Marland KitchenUrine  Culture:07/2016: No  Growth.08/26/2016: <=10,000 CFU/mL Mixed  bacterial flora.09/16/2016: 50,000-99,000 CFU/mLGroup B  Streptococcus09/2018: >=100,000 CFU/mLGroup B Streptococcus.She  voids with good stream and good control..She has no other  complaints. Patient denies gross hematuria, urethral discharge,  pyuria, dysuria, UTI, fever, chills, night sweats, weight loss  and skeletal pain..  .  CURRENT MEDICATIONS  .  Taking CeFAZolin Sodium-NaCl 1-0.9 GM/10ML Solution Prefilled  Syringe as directed Intravenous 2g Q8H  Taking Coumadin , Notes: INR 2-3  Taking Gabapentin 400 mg Capsule 2 capsule Orally Twice a day  Taking Lidocaine 5 % Patch 1 application to affected area as  needed Externally Once a day  Taking Omeprazole 20 MG Capsule Delayed Release 1 capsule Orally  twice daily  Taking Paroxetine HCl 20 MG Tablet 1 tablet in the morning Orally  Once a day  Taking Percocet 5-325 MG Tablet 1 tablet as needed Orally every 8  hrs PRN  Taking Polyethylene Glycol 3350 - Powder , Notes: PRN  Taking Pulmicort 0.5 MG/2ML Suspension 2 ml Inhalation BID  Taking Senna 8.6 MG Tablet 2 tablets at bedtime as needed Orally  Once a day, Notes: PRN  Taking Tamsulosin HCl 0.4 MG Capsule 1 capsule Orally Once a day  Taking trazadone 50 mg 1 oral nightly for sleep  Taking Tums (calcium carbonate) 200 mg PRN p.o. twice daily  Taking Ventolin HFA , Notes: PRN SOB  Taking Zofran 4 MG Tablet as directed Orally Q6H PRN  Not-Taking/PRN Acetaminophen 325 MG Capsule 1 capsule as needed  Orally every 4 hrs  Not-Taking/PRN Alprazolam 0.5 MG Tablet 1 tablet Orally PRN TID  Not-Taking/PRN Ferrous Sulfate 325 (65 Fe) MG Tablet 1 tablet  Orally Once a day  Not-Taking/PRN Narcan 4 MG/0.1ML Liquid Nasally  Not-Taking/PRN Valium 5 MG Tablet 1 tablet as needed Orally Q6H  Medication List reviewed and reconciled with the patient  .  PAST MEDICAL HISTORY  .  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  ALLERGIES  .  Toradol  .  SURGICAL HISTORY  .  Appendectomy 1986  Casearean section 1989  Ectopic pregnancy surgery 2001  .  FAMILY HISTORY  .  Mother: deceased  Father: alive, diagnosed with Heart Disease  2 brother(s) , 2 sister(s) . 2 son(s) , 4 daughter(s) .  Mother died of scolerosis of the liver in 75. Diabetes paternal  grandmother.  .  SOCIAL HISTORY  .  [name]  Lives with: 6 kids and grandchildren.  .  Alcohol Denies.  .  Illicit drugs: Never.  .  Caffeine: 4 cups of coffee.  .  Tobaccohistory:Currently smoking Pack Year History: Started at 13  years until 1/2 pack per year .  [name]  HOSPITALIZATION/MAJOR DIAGNOSTIC PROCEDURE  .  Sepsis ( August-November-December- February) 2017-2018  .  REVIEW OF SYSTEMS  .  Urology ROS:  .  Constitutional:    No fevers/chills/weight loss or general  weakness . Eyes:    No decreased visual acuity, loss of vision or  diplopia . Integumentary:    No rash or skin lesions, No changes  in hair or nail character . Lungs:    no shortness of breath, new  frequent cough, no history of asthma . Cardiovascular:    No  chest pain, palpitations . Gastrointestinal:    No abdominal  pain/nausea/vomiting/bowel changes . Genitourinary:    No  urethral discharge or dysuria . Musculoskeletal:    No  joint\muscle pain, decreased mobility or joint swelling .  Neurological:    No headache, dizziness, seizures, light  headedness, memory loss or numbness . Psychiatry:    No  mood/behavioral changes, anxiety or depression . Endocrine:    No  hirsutism or excessive hair loss, polyuria, polydipsia or  alopecia . Hematologic/Lymphatic:    No lymphadenopathy, easy  bruising or abnormal  bleeding . Allergic/Immunologic:    No  environmental or food allergies .  [name]  VITAL SIGNS  .  Pain scale 8, Ht-in 61.50, Wt-lbs 155.4, BMI 28.88, BP 115/77.  .  PHYSICAL EXAMINATION  .  Urology PE:  General Appearance:  well-developed, well nourished, NAD, normal  secondary sexual characteristics. HEENT:  normocephalic,  atraumatic, PERRLA, EOMI, anicteric, no nasal discharge, sinuses  non-tender, oral pharynx within normal limits. Skin:  no bruises,  no petechiae, no rashes or lesions. Neck:  supple, no masses,  trachea in midline, normal range of motion. Chest  normal AP  diameter, no rib tenderness. Lungs:  normal respirations,  symmetric excursion with no accesory muscle use. Back:  no CVAT,  vertebral column aligned, no sacral [place] or dimples, no  scoliosis/kyphosis, masses or tenderness. Cardiovascular:  RRR,  no murmur, pulses intact. Abdomen:  Bruised abdominal wall, Right  anterior abdominal wall with previous abscess drainage sites  dressings  dry and clean, Abdomen globular obese, soft, benign,  non-tender, non-distended, no palpable masses, no  hepato-splenomegaly, no hernias, bladder not palpable.  Extremities:  no clubbing, cyanosis or pitting edema.  Musculo-Skeletal:  no muscle wasting, joint swelling or  tenderness. Lymphatic:  no cervical, axillary or inguinal  adenopathy. Neurologic:  oriented x3, no focal deficits.  GU Female:  Genitalia:  Normally developed female genitalia. Pelvic exam:   Deferred as per patient request.  .  ASSESSMENTS  .  Staghorn calculus - N20.0 (Primary), Long discussion regarding  management of Staghorn calculus  .  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  Hydronephrosis, right - N13.30, Secondary to Staghorn calculus  .  Inguinal pain, unspecified laterality - R10.30  .  TREATMENT  .  Staghorn calculus  Notes: request recent medical records.  .  .  Emphysematous pyelonephritis of right kidney  LAB:  Urine Culture (CXURN)  .  LAB: Urine Dip POC  Negative for LE, NIT, and Blood  Notes: Long discussion regarding plan of managementwill plan to  follow up with local urology (Dr. Merilyn Baba) to discuss plan for  surgery.  .  .  Others  Notes: RTC PRNDue to the complexity of his condition Dr Dorothea Glassman  spent 50 minutes with patient reviewing his medical history and  for management, treatment, counselling, and coordination of care  for his condition. 50% of this time was counselling the patient.  [name]  PREVENTIVE MEDICINE  .  Counseling:  Smoking   . BMI Management   .  [name]  PROCEDURE CODES  .  7532 URO MD URINALYSIS AUTO W/O MICROSCOPY  .  FOLLOW UP  .  RTC with discs of all previous radiographic studies for  coordination of care.  .  Electronically signed by [name] , MD on  03/27/2018 at 06:03 PM EDT  .  Document electronically signed by [name]    .

## 2018-02-11 ENCOUNTER — Ambulatory Visit

## 2018-03-12 ENCOUNTER — Ambulatory Visit

## 2018-03-19 ENCOUNTER — Ambulatory Visit

## 2018-06-29 ENCOUNTER — Ambulatory Visit: Admit: 2018-06-29 | Payer: No Typology Code available for payment source

## 2018-06-29 ENCOUNTER — Ambulatory Visit: Admitting: Urology

## 2018-06-29 ENCOUNTER — Ambulatory Visit: Admitting: Internal Medicine

## 2018-06-29 ENCOUNTER — Ambulatory Visit

## 2018-06-29 LAB — HX BF-CHEM/URINE
HX ALBUMIN RANDOM URINE: 36.8 mg/dL
HX ALBUMIN/CREATININE RATIO, URINE: 1025 mg/g — ABNORMAL HIGH (ref 0–30)
HX CREATININE, RANDOM URINE: 35.91 mg/dL
HX MICROALBUMIN CALC: 1.02 mg/mg
HX PROTEIN RANDOM, URINE (INCLUDES CREATININE): 1782 mg/g — ABNORMAL HIGH (ref 0–200)
HX PROTEIN, RANDOM URINE(INCLUDES CREATININE): 64 mg/dL — ABNORMAL HIGH (ref 0–15)

## 2018-06-29 LAB — HX HEM-ROUTINE
HX BASO #: 0 10*3/uL (ref 0.0–0.2)
HX BASO: 0 %
HX EOSIN #: 0.2 10*3/uL (ref 0.0–0.5)
HX EOSIN: 3 %
HX HCT: 42.2 % (ref 32.0–45.0)
HX HGB: 13.5 g/dL (ref 11.0–15.0)
HX IMMATURE GRANULOCYTE#: 0 10*3/uL (ref 0.0–0.1)
HX IMMATURE GRANULOCYTE: 0 %
HX LYMPH #: 1.5 10*3/uL (ref 1.0–4.0)
HX LYMPH: 18 %
HX MCH: 31.8 pg (ref 26.0–34.0)
HX MCHC: 32 g/dL (ref 32.0–36.0)
HX MCV: 99.5 fL — ABNORMAL HIGH (ref 80.0–98.0)
HX MONO #: 0.5 10*3/uL (ref 0.2–0.8)
HX MONO: 7 %
HX MPV: 10.1 fL (ref 9.1–11.7)
HX NEUT #: 5.6 10*3/uL (ref 1.5–7.5)
HX NRBC #: 0 10*3/uL
HX NUCLEATED RBC: 0 %
HX PLT: 239 10*3/uL (ref 150–400)
HX RBC BLOOD COUNT: 4.24 M/uL (ref 3.70–5.00)
HX RDW: 14.6 % — ABNORMAL HIGH (ref 11.5–14.5)
HX SEG NEUT: 71 %
HX WBC: 7.9 10*3/uL (ref 4.0–11.0)

## 2018-06-29 LAB — HX CHEM-PANELS
HX ANION GAP: 7 (ref 3–14)
HX BLOOD UREA NITROGEN: 21 mg/dL (ref 6–24)
HX CHLORIDE (CL): 103 meq/L (ref 98–110)
HX CO2: 28 meq/L (ref 20–30)
HX CREATININE (CR): 1.01 mg/dL (ref 0.57–1.30)
HX GFR, AFRICAN AMERICAN: 75 mL/min/{1.73_m2}
HX GFR, NON-AFRICAN AMERICAN: 65 mL/min/{1.73_m2}
HX GLUCOSE: 82 mg/dL (ref 70–139)
HX POTASSIUM (K): 4.7 meq/L (ref 3.6–5.1)
HX SODIUM (NA): 138 meq/L (ref 135–145)

## 2018-06-29 LAB — HX CHEM-OTHER
HX ALBUMIN: 4.2 g/dL (ref 3.4–4.8)
HX CALCIUM (CA): 9.6 mg/dL (ref 8.5–10.5)
HX MAGNESIUM: 1.7 mg/dL (ref 1.6–2.6)
HX PHOSPHORUS: 2.8 mg/dL (ref 2.7–4.5)

## 2018-06-29 LAB — HX DIABETES
HX ALBUMIN RANDOM URINE: 36.8 mg/dL
HX GLUCOSE: 82 mg/dL (ref 70–139)

## 2018-06-29 NOTE — Progress Notes (Signed)
* * *        Emily Ford**    --- ---    47 Y old Female, DOB: 07-02-1971, External MRN: [id number]    Account Number: [id number]    [address], [place], [id number]    Home: [phone]    Insurance: [id number] [employer] [facility]    PCP: Adah Salvage, MD Referring: Langley Adie    Appointment Facility: [facility]        * * *    06/29/2018  Progress Notes: [name], MD **CHN#:** [id number]    --- ---    ---         **Reason for Appointment**    ---       1\. Urinary retention    ---    2\. Right xanthogranulomatous pyelonephritis    ---    3\. Staghorn calculus    ---    4\. Severe hydronephrosis    ---    5\. Inguinal pain    ---       **History of Present Illness**    ---     _Adult Urology_ :    This is a 47 year old female with a complex medical history including, but not  limited to anxiety, depression, arthritis, back problem, migraine, eating  disorder, kidney stones, hypokalemia, otitis media, acute pharyngitis,  disorder of kidney and ureter, UTIs, dysmenorrhea, contact dermatitis,  shoulder joint pain, neck pain, backache, DSTYK, paresthesia, edema, dyspnea,  abdominal pain, right hydronephrosis, staghorn calculus, Inguinal pain and  right xanthogranulomatous pyelonephritis, who is here today for a follow-up  visit.    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 She was  recently admitted at [facility]. Leane Call (06/06/18) for about a week for replacement of  abscess drainage tube. She was started on long term abx for Cefazolin IV. She  previously was able to get infusions at home, but now has to either go to  [place] or [facility]. During her [place]. Leane Call (06/06/18) for about a week  for replacement of abscess drainage tube. While admitted, she was found to  have >1 L of urinary retention and has been unable to fully empty her bladder  since then. She currently has a chronic  Foley Catheter and [facility].    .    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    She also has a history of right hydronephrosis.    [name]    [facility] Kidney in 03/2016 (Report Only) reviewed by [name] revealed there is  relatively decreased right renal uptake. Right kidney takes up approximately  22.3 % of the administered radiopharmaceutical and left kidney takes up  approximately 77.7 %. Evidence of right renal obstruction.    .    [name] also has a history of a staghorn calculus.    .    Renal-Spect in 01/2018 reviewed by [name] revealed normal renal cortical  imaging of the left kidney, without evidence of perfusion abnormality.    She has a history of UTI/urosepsis but last episode in April 2018, since then  she has been on home IV antibiotics since July 108. He has been sepsis since  that time.    .    BUN, Cr, GFR:  07/2016: 20, 1.07, 68.    08/2016: 12, 1.10, 66.    10/2017: 19, 1.13, 63.    06/2018: 21, 1.01, 65.    [name]    PSA:    11/2016: 0.69.    [name]    Urine Culture:    07/2016: No Growth.    08/26/2016: <=10,000 CFU/ml Mixed bacterial flora.    09/16/2016: 50,000-99,000 CFU/ml Group B Streptococcus    04/2017: >=100,000 CFU/ml Group B Streptococcus    11/2016: No growth.    .    She voids with good stream and good control.    .    She has no other complaints. Patient denies gross hematuria, urethral  discharge, pyuria, dysuria, UTI, fever, chills, night sweats, weight loss and  skeletal pain.    [name] Assessment_ :    Is Patient a Fall Risk? : No.      **Current Medications**    ---    Taking     * Acetaminophen 325 MG Capsule 1 capsule as needed Orally every 4 hrs    ---    * Albuterol     ---    * Alprazolam 0.5 MG Tablet 1 tablet Orally PRN TID    ---    * CeFAZolin Sodium-NaCl 1-0.9 GM/10ML Solution Prefilled Syringe as directed Intravenous 2g Q8H    ---    * Centrum - Tablet as  directed Orally     ---    * Coumadin     ---    * Ferrous Sulfate 325 (65 Fe) MG Tablet 1 tablet Orally Once a day    ---    * Fioricet 50-300-40 MG Capsule 1 capsule as needed Orally every 4 hrs    ---    * Flomax 0.4 MG Capsule 1 capsule Orally Once a day    ---    * Gabapentin 400 mg Capsule 2 capsule Orally Twice a day    ---    * Gas-X 80 MG Tablet Chewable 1 tablet after meals and at bedtime as needed Orally Four times a day    ---    * Hemorrhoidal-HC     ---    * Lidocaine 5 % Patch 1 application to affected area as needed Externally Once a day    ---    * Narcan 4 MG/0.1ML Liquid Nasally     ---    * Niferex - Tablet as directed Orally     ---    * Omeprazole 20 MG Capsule Delayed Release 1 capsule Orally twice daily    ---    * OXYCODONE 5 MG TABLET     ---    * Paroxetine HCl 20 MG Tablet 1 tablet in the morning Orally Once a day    ---    * Paxil 20 MG Tablet 1 tablet in the morning Orally Once a day    ---    * Percocet 5-325 MG Tablet 1 tablet as needed Orally every 8 hrs PRN    ---    * Polyethylene Glycol 3350 - Powder     ---    * Prilosec 10 MG Packet 1 packet 30 minutes before morning meal Orally Once a day    ---    * Proventil     ---    * Pulmicort 0.5 MG/2ML Suspension 2 ml Inhalation BID    ---    * Senna 8.6 MG Tablet 2 tablets at bedtime  as needed Orally Once a day    ---    * Tamsulosin HCl 0.4 MG Capsule 1 capsule Orally Once a day    ---    * trazadone 50 mg 1 oral nightly for sleep    ---    * Tums (calcium carbonate) 200 mg PRN p.o. twice daily    ---    * Valium 5 MG Tablet 1 tablet as needed Orally Q6H    ---    * Ventolin HFA     ---    * Xanax 0.5 MG Tablet 1 tablet Orally Twice a day    ---    * Zofran 4 MG Tablet as directed Orally Q6H PRN    ---    * Medication List reviewed and reconciled with the patient    ---       **Past Medical History**    ---       Anxiety.        ---    Depression.        ---    Arthritis.        ---    Back problem.        ---    Migraine.        ---     Eating disorder.        ---    Kidney stones.        ---    Hypokalemia.        ---    Otitis media.        ---    Acute pharyngitis.        ---    Disorder of kidney and ureter.        ---    Urinary tract infections (UTIs).        ---    Dysmenorrhea.        ---    Contact dermatitis.        ---    Shoulder joint pain.        ---    Neck pain.        ---    Backache.        ---    DSTYK.        ---    Parethesia.        ---    Edema.        ---    Dyspnea.        ---    Abdominal pain.        ---    Pyelonephritis.        ---    Right hydronephrosis.        ---    Staghorn calculus.        ---    Sepsis .        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        ---    Left MCA aneurysm.        ---    Right ophthalmic ICA aneurysm.        ---    Urinary rentention.        ---    Urinary retention.        ---       **Surgical History**    ---       Appendectomy 1986    ---    Casearean section 1989    ---  Ectopic pregnancy surgery 2001    ---       **Family History**    ---       Mother: deceased    ---    Father: alive, diagnosed with Heart Disease    ---    2 brother(s) , 2 sister(s) . 2 son(s) , 4 daughter(s) .    ---    Mother died of scolerosis of the liver in 1989. Diabetes paternal grandmother.    ---       **Social History**    ---    Lives with: 6 kids and grandchildren.    Alcohol  Denies.    Illicit drugs: Never.    Caffeine: 4 cups of coffee.    Tobacco  history: Currently smoking Pack Year History: Started at 13 years  until 1/2 pack per year .      **Allergies**    ---       Toradol    ---       **Hospitalization/Major Diagnostic Procedure**    ---       Sepsis ( August-November-December- February) 2017-2018    ---       **Review of Systems**    ---     _Urology ROS_ :    Constitutional: No fevers/chills/weight loss or general weakness. Eyes: No  decreased visual acuity, loss of vision or diplopia. Integumentary: No rash or  skin lesions, No changes in hair or nail character. Lungs: no  shortness of  breath, new frequent cough, no history of asthma. Cardiovascular: No chest  pain, palpitations. Gastrointestinal: No abdominal pain/nausea/vomiting/bowel  changes. Genitourinary: chronic foley d/t retention. Has a nephorsotomy tube  and abcess drainage tube. . Musculoskeletal: No joint\muscle pain, decreased  mobility or joint swelling. Neurological: No headache, dizziness, seizures,  light headedness, memory loss or numbness. Psychiatry: No mood/behavioral  changes, anxiety or depression. Endocrine: No hirsutism or excessive hair  loss, polyuria, polydipsia or alopecia. Hematologic/Lymphatic: No  lymphadenopathy, easy bruising or abnormal bleeding. Allergic/Immunologic: No  environmental or food allergies.          **Vital Signs**    ---    Pain scale 9, Ht-in 61.50, Wt-lbs 158, BMI 29.37, BP 129/74, HR 101, BSA 1.76,  Ht-cm 156.21, Wt-kg 71.67, Wt Change -.8 lb.       **Physical Examination**    ---     _Urology PE_ :    General Appearance: well-developed, well nourished, NAD, normal secondary  sexual characteristics. HEENT: normocephalic, atraumatic, PERRLA, EOMI,  anicteric, no nasal discharge, sinuses non-tender, oral pharynx within normal  limits. Skin: no bruises, no petechiae, no rashes or lesions. Neck: supple, no  masses, trachea in midline, normal range of motion. Chest  normal AP diameter,  no rib tenderness. Lungs: normal respirations, symmetric excursion with no  accesory muscle use. Back: no CVAT, vertebral column aligned, no sacral [place]  or dimples, no scoliosis/kyphosis, masses or tenderness. Cardiovascular: RRR,  no murmur, pulses intact. Abdomen:  Has a right nephorsotomy tube and abcess  drainage tube in place.  Abdomen globular obese, soft, benign, non-tender,  non-distended, no palpable masses, no hepato-splenomegaly, no hernias, bladder  not palpable. Extremities: no clubbing, cyanosis or pitting edema. Musculo-  Skeletal: no muscle wasting,  joint swelling or tenderness.  Lymphatic: no  cervical, axillary or inguinal adenopathy. Neurologic: oriented x3, no focal  deficits.    _GU Female_ :    Genitalia: Normally developed female genitalia. Pelvic exam: Deferred as per  patient request.          **  Assessments**    ---    1\. 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 Her staghorn calculus/proteus c/b  xanthogranulomatous pyelonephritis with perinephric abscesses s/p nephrostomy  and pigtail placement    ---    3\. Hydronephrosis, right - N13.30, Secondary to Staghorn calculus    ---    4\. Inguinal pain, unspecified laterality - R10.30    ---       **Treatment**    ---       **1\. Emphysematous pyelonephritis of right kidney**    _LAB: Urine Dip POC_ 2+ Le, Negative for NIT and Blood.    Notes: Long discussion regarding plan of management, do agree he requires  nephrectomy to manage recurring UTIs and abscess. He has discussed with local  urology (Dr. Merilyn Baba), will decide how she wants to proceed. Prior to  proceeding with surgical option will need clearance from neuro with regards to  cerebral aneurysm .    ---        **2\. Others**    Notes: RTC pending his decision on how he proceeds with surgery    Due to the complexity of her condition Dr Dorothea Glassman spent 50 minutes with patient  reviewing her medical history and for management, treatment, counselling, and  coordination of care for her condition. 50% of this time was counselling the  patient. .      **Preventive Medicine**    ---       Counseling: Smoking . BMI Management .    ---      **Procedure Codes**    ---       1027 URO MD URINALYSIS AUTO W/O MICROSCOPY    ---       **Follow Up**    ---    RTC, prn    Electronically signed by [name] , MD on 07/05/2018 at 06:43 PM EST    Sign off status: Completed        * * *        [facility]    [name]    [place], [place] [address]     Tel: [phone]    Fax: [phone]              * * *          Patient: KARILYNN, CARRANZA DOB: [birth year] Progress Note: [name], MD  06/29/2018    ---    Note generated by eClinicalWorks EMR/PM Software ([email or web address])

## 2018-06-29 NOTE — Progress Notes (Signed)
* * *        Emily Ford**    --- ---    82 Y old Female, DOB: 1971-02-14, External MRN: [id number]    Account Number: [id number]    [address], [place], [id number]    Home: [phone]    Insurance: [id number] [facility] [place]    PCP: Adah Salvage, MD Referring: Langley Adie    Appointment Facility: Nephrology        * * *    06/29/2018   **Appointment Provider:** Heloise Ochoa, PA **CHN#:** [id number]    --- ---      **Supervising Provider:** Prudy Feeler MD    ---         **Reason for Appointment**    ---       1\. 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    Patient was initially referred from [name] for evaluation of renal  function given his tentative plan for nephrectomy. She is on chronic IV  antibiotics (Cefazolin). She previously was able to get infusions at home, but  now has to either go to [place] or [facility]. She was recently admitted  at [facility] (06/06/18) for about a week for replacement of abcess drainage  tube. While admitted, she was found to have >1L of urinary rentention and has  been unable to fully empty her bladder since then. She currently has a chronic  foley. There is no concrete timeline in regards to nephrectomy of her right  kidney. She has an appointment at 1:30 today for follow-up with [name].    Patient reports chronic lumbar pain due to degenerative disc disease and  regularly visits a pain clinic for symptom management. She also reports SOB,  which she has at baseline due to COPD and asthma. She otherwise denies gross  hematuria, urethral discharge, pyuria, dysuria, fever, chills,  chest pain,  N/V/D, lower extremity swelling, or dizziness/lightheadedness.     _PAST IMAGING_ :    Split Function Renal Scan (01/2018):    IMPRESSION: Normal renal cortical imaging of the left kidney, without evidence  of perfusion abnormality.    NM Renal Scan (03/2016)    Report: right kidney 22.3% uptake and left kidney with approximately 77.7%.     _Associated Providers_ :    PCP: Dr. Pricilla Holm    Urology: recently referred to [name], prior urologist Dr. Richardean Canal.       **Current Medications**    ---    Taking     * Acetaminophen 325 MG Capsule 1 capsule as needed Orally every 4 hrs    ---    * Alprazolam 0.5 MG Tablet 1 tablet Orally PRN TID    ---    * Bisac-Evac 10 MG Suppository 1 suppository as needed Rectal Once a day    ---    * CeFAZolin Sodium-NaCl 1-0.9 GM/10ML Solution Prefilled Syringe as directed Intravenous 2g Q8H    ---    * Coumadin     ---    * Dulera 200-5 MCG/ACT Aerosol 2 puffs Inhalation Twice a day    ---    *  Fioricet 50-325-40 MG Tablet 1 tablet as needed Orally every 4 hrs    ---    * Gabapentin 400 mg Capsule 2 capsule Orally Twice a day    ---    * Lidocaine 5 % Patch 1 application to affected area as needed Externally Once a day    ---    * Narcan 4 MG/0.1ML Liquid Nasally     ---    * Omeprazole 20 MG Capsule Delayed Release 1 capsule Orally twice daily    ---    * Paroxetine HCl 20 MG Tablet 1 tablet in the morning Orally Once a day    ---    * Percocet 5-325 MG Tablet 1 tablet as needed Orally every 8 hrs PRN    ---    * Polyethylene Glycol 3350 - Powder     ---    * Pulmicort 0.5 MG/2ML Suspension 2 ml Inhalation BID    ---    * Senna 8.6 MG Tablet 2 tablets at bedtime as needed Orally Once a day    ---    * Simethicone-80 80 MG Tablet Chewable 1 tablet after meals and at bedtime as needed Orally Four times a day    ---    * Tamsulosin HCl 0.4 MG Capsule 1 capsule Orally Once a day    ---    * trazadone 50 mg 1 oral nightly for sleep    ---    * Tums (calcium  carbonate) 200 mg PRN p.o. twice daily    ---    * Valium 5 MG Tablet 1 tablet as needed Orally Q6H    ---    * Ventolin HFA     ---    * Xarelto 10 MG Tablet 1 tablet with food Orally Once a day    ---    * Zofran 4 MG Tablet as directed Orally Q6H PRN    ---    Not-Taking/PRN    * Ferrous Sulfate 325 (65 Fe) MG Tablet 1 tablet Orally Once a day    ---    * Medication List reviewed and reconciled with the patient    ---       **Past Medical History**    ---       Anxiety.        ---    Depression.        ---    Arthritis.        ---    Back problem.        ---    Migraine.        ---    Eating disorder.        ---    Kidney stones.        ---    Hypokalemia.        ---    Otitis media.        ---    Acute pharyngitis.        ---    Disorder of kidney and ureter.        ---    Urinary tract infections (UTIs).        ---    Dysmenorrhea.        ---    Contact dermatitis.        ---    Shoulder joint pain.        ---    Neck pain.        ---    Backache.        ---  DSTYK.        ---    Parethesia.        ---    Edema.        ---    Dyspnea.        ---    Abdominal pain.        ---    Pyelonephritis.        ---    Right hydronephrosis.        ---    Staghorn calculus.        ---    Sepsis .        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        ---    Left MCA aneurysm.        ---    Right ophthalmic ICA aneurysm.        ---    Urinary rentention.        ---       **Surgical History**    ---       Appendectomy 1986    ---    Casearean section 1989    ---    Ectopic pregnancy surgery 2001    ---       **Family History**    ---       Mother: deceased    ---    Father: alive, diagnosed with Heart Disease    ---    2 brother(s) , 2 sister(s) . 2 son(s) , 4 daughter(s) .    ---    Mother died of scolerosis of the liver in 1989. Diabetes paternal grandmother.    ---       **Social History**    ---    Lives with: 6 kids and grandchildren.    Alcohol    _Denies_    Illicit drugs: Never.    Caffeine: 4 cups of  coffee.    Tobacco    history: _Currently smoking Pack Year History: Started at 13 years until 1/2  pack per year_       **Allergies**    ---       Toradol    ---       **Hospitalization/Major Diagnostic Procedure**    ---       Sepsis ( August-November-December- February) 2017-2018    ---       **Review of Systems**    ---     _Nephrology_ :    CONSTITUTIONAL: Denies unintentional weight change, fatigue, fever, chills. [name]  EYES: Denies recent visual changes . HENT: Reports migraines. Denies auditory  changes, metallic taste, dysphagia. HEART: Denies chest pain, palpitations,  edema. GASTROINTESTINAL: Reports nausea and constipation. Denies vomiting,  diarrhea, abdominal pain, blood in stools. RESPIRATORY: Reports chronic SOB  (at baseline), denies cough. GENITOURINARY: Reports difficulty urinating. Has  chronic foley in place. Denies gross hematuria or pyuria. [name] MUSCULOSKELETAL:  Reports chronic lumbar pain, left sided sciatic pain with associated left leg  weakness. . INTEGUMENTARY (SKIN AND/OR BREAST): Denies rashes, non healing  wounds, change in moles, any new or abnormal lesions. ENDOCRINE: Denies  increased thirst or urination, intolerance to heat or cold.  HEMATOLOGIC/LYMPHATIC: Denies easy bruising, swollen or tender glands.  PSYCHIATRIC: Reports anxiety.[name] NEUROLOGICAL SYSTEM: Denies dizziness,  numbness, tingling, change in mental status.          **Vital Signs**    ---    Ht-in 61.50, Wt-lbs 158.8, BMI 29.52, BP 111/77, HR 105, BSA 1.77,  Ht-cm  156.21, Wt-kg 72.03, Wt Change 3.4 lb.       **Physical Examination**    ---     _NEPHROLOGY_ :    CONSTITUTIONAL / VITALS: Appears well and in no apparent distress.    HEENT: Eyes with no pallor or icterus. Oropharynx clear and oral mucous  membrane moist.    CVS: JVD not elevated. No carotid bruit. S1 and S2 normal, tachycardic and  rhythm. No murmur, rub or gallop. No lower extremity edema.    LUNGS: Clear to auscultation bilaterally. No wheezes, rales, or  rhonchi. .    ABDOMEN: Right flank with 2 tubes: nephrostomy and abscess drainage. Abdomen  distended, non-tender no CVA tenderness. Foley catheter in place.[name]    EXTREMITIES: No lower extremity edema. [name]    SKIN: No pallor, icterus, bruise or rash.    NEURO: Alert and oriented to time, place and person. No asterixis.    MSK: No joint swelling.          **Assessments**    ---    1\. LABS - See Below (Primary)    ---    2\. 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 Today's eGFR of 27ml/min/1.73m2  with creatinine of 1.01mg /dL and UACR ~0Y. 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 Based on current  regimen of chronic antibiotics, PICC line in place on Xarelto, it is our  recommendation that she move forward with nephrectomy.    ---       **Treatment**    ---       **1\. LABS**    _LAB: Albumin (ALB)_   Albumin  4.2    3.4 - 4.8 - g/dL    --- --- --- ---    _LAB: Magnesium (MG)_ Magnesium  1.7    1.6 - 2.6 - mg/dL    --- --- --- ---    _LAB: Calcium (Ca)_ Calcium (Ca)  9.6    8.5 - 10.5 - mg/dL    --- --- --- ---    _LAB: Glucose (GLU)_ Glucose  82    70 - 139 - mg/dL    --- --- --- ---    _LAB: Phosphorus (PHOS)_ Phosphorus  2.8    2.7 - 4.5 - mg/dL    --- --- --- ---    _LAB: Blood Urea Nitrogen (BUN)_ Blood Urea Nitrogen  21    6 - 24 - mg/dL    --- --- --- ---    _LAB: Albumin, random urine w/ creat (UALB)_ Albumin Random, Urine  36.8     - mg/dL    --- --- --- ---    Microalbumin Calc, Urine  1.02     \- mg/mg    --- --- --- ---    Creatinine Random, Urine  35.91     \- mg/dL    --- --- --- ---     Albumin/Creatinine Ratio, Urine  1025  H  0 - 30 - mg/g    --- --- --- ---    _LAB: Protein/Creatinine, Random Urine (UPROT)_ Protein, Random Urine  (includes Creatinine)  64  H  0 - 15 - mg/dL    --- --- --- ---  Creatinine Random, Urine  35.91     \- mg/dL    --- --- --- ---    Protein/Creatinine Ratio, Urine  1782  H  0 - 200 - mg/g    --- --- --- ---    _LAB: CBC/DIFF with PLT (CBCWD)_ WBC  7.9    4.0 - 11.0 - K/uL    --- --- --- ---    RBC  4.24    3.70 - 5.00 - M/uL    --- --- --- ---    HGB  13.5    11.0 - 15.0 - g/dL    --- --- --- ---    HCT  42.2    32.0 - 45.0 - %    --- --- --- ---    MCV  99.5  H  80.0 - 98.0 - fL    --- --- --- ---    MCH  31.8    26.0 - 34.0 - pg    --- --- --- ---    MCHC  32.0    32.0 - 36.0 - g/dL    --- --- --- ---    RDW  14.6  H  11.5 - 14.5 - %    --- --- --- ---    PLT  239    150 - 400 - K/uL    --- --- --- ---    MPV  10.1    9.1 - 11.7 - fL    --- --- --- ---    SEG NEUT  71     \- %    --- --- --- ---    LYMPH  18     \- %    --- --- --- ---    MONO  7     \- %    --- --- --- ---    EOS  3     \- %    --- --- --- ---    BASO  0     \- %    --- --- --- ---    NEUT #  5.6    1.5 - 7.5 - K/uL    --- --- --- ---    LYMPH #  1.5    1.0 - 4.0 - K/uL    --- --- --- ---    MONO #  0.5    0.2 - 0.8 - K/uL    --- --- --- ---    EOSIN #  0.2    0.0 - 0.5 - K/uL    --- --- --- ---    BASO #  0.0    0.0 - 0.2 - K/uL    --- --- --- ---    Imm Grnas  0     \- %    --- --- --- ---    NRBC  0     \- %    --- --- --- ---    Imm Grans, Abs  0.0    0.0 - 0.1 - K/uL    --- --- --- ---    NRBC, Abs  0.0    <0.0 - K/uL    --- --- --- ---    _LAB: Electrolytes (Na, K, Cl, CO2) LYTES_ ANION GAP  7    3 - 14 -    --- --- --- ---    CL  103    98 - 110 - mEq/L    --- --- --- ---    CO2  28  20 - 30 - mEq/L    --- --- --- ---    K  4.7    3.6 - 5.1 - mEq/L    --- --- --- ---    NA  138    135 - 145 - mEq/L    --- --- --- ---    _LAB: Creatinine (CR)_ Creatinine (CR)  1.01    0.57 - 1.30 - mg/dL     --- --- --- ---    _LAB: KBPC Urinalysis_ SG  1.005       --- --- --- ---    pH  6.5       --- --- --- ---    LEU  +2       --- --- --- ---    NIT  neg       --- --- --- ---    PRO  +1       --- --- --- ---    GLU  norm       --- --- --- ---    KET  neg       --- --- --- ---    UBG  norm       --- --- --- ---    BIL  neg       --- --- --- ---    BLD  +1       --- --- --- ---    _LAB: GFR, AA_ GFR, AA  75    >60 - mL/min/1.69m2    --- --- --- ---    _LAB: GFR, NAA_ GFR, NAA  65    >60 - mL/min/1.45m2    --- --- --- ---         **2\. Xanthogranulomatous pyelonephritis**    Clinical Notes: - current eGFR is representative of left kidney function.  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    - eGFR today 73ml/min/1.73m2 with creatinine of 1.01mg /dL    - UACR ~2Z, not on ACEi.    - BP <130/80. Based on prior hospital visits, and BP taken at home, BP runs  on the lower side: 100-115/60-70s.    - will trend proteinuria and BP.    - at this point, chronic infection and need for nephrectomy supersedes  proteinuria.    - Will continue to monitor.        **3\. Others**    Notes:    - chronic abx; referred to ID for evaluation of abx and for hopes that she  can receive treatment at home.    .    Clinical Notes:    **ATTENDING ATTESTATION** : I personally interviewed and examined the patient  and reviewed PA Layken Beg's note. I agree with the history, examination,  assessment and plan as she sets them forth. 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 She is a non-linear historian. As best we can tell, she continues  to be on IV antibiotics 3 times a day, though it is unclear if this is  continuous or intermittent courses. Has not been able to get in antibiotics at  home, so is living at [place] (?) and going to [place] for [facility].  Still has purulent drainage from her nephrostomy tube. Also needs to get  nephrostomy placed  periodically, including just a few days ago when it fell  out. She seems a bit unclear about the role of the various members of her  health care team: Thinks I am prescribing her antibiotics, and interventional  radiology is considering taking her kidney out-1 fact I am not prescribing her  antibiotics and urology is the potential service to do nephrectomy.    Her primary goal is to go home. She is unable to do so I believe because of  her IV antibiotics. She also continues to have a PICC, which ultimately will  cause an intravascular infection. She is intermittently on Xarelto for what  sounds like line-associated clots, which also could potentially be avoided if  she was no longer on antibiotics. Given absence of function of the right  kidney on split testing, I see no reason the kidney should not come out ASAP,  as the current plan is going to lead to disaster. We may refer to ID to try to  straighten out this bizarre antibiotic plan, though as her story unfolds I  worry that much of this is logistic rather medical complications. ADDENDUM: As  we gain more information, I think she may have exhausted her home antibiotic  options which is why she is getting them at [facility]. I have contacted Dr.  the longer expressed my opinion that surgery appears to the only option to get  her out of her current difficult situation. There is another urologist  involved complicating matters I believe. We also found today that she has  proteinuria, and a sample which we believe is from her Foley; unclear if this  could be drainage from her scarred kidney versus some sort of involvement of  her (so far) adequately-functioning left kidney. 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 Further comments per PA Emily Ford, above..      **Procedure Codes**    ---       16109 URINALYSIS TEST PROCEDURE    ---       **Follow Up**    ---    6  Months    **Appointment Provider:** Heloise Ochoa, PA    Electronically signed by Prudy Feeler MD on 07/02/2018 at 06:03 PM EST    Sign off status: Completed        * * *        Nephrology    [address]    [address] 4th floor    [place], [place] [address]    Tel: [phone]    Fax: [phone]              * * *          Patient: Emily, Ford DOB: 1971/02/10 Progress Note: Heloise Ochoa,  PA 06/29/2018    ---    Note generated by eClinicalWorks EMR/PM Software ([email or web address])

## 2018-06-29 NOTE — Progress Notes (Signed)
.  Progress Notes  .  Patient: Emily Ford  Provider: Anise Ford    .  DOB: 1971-07-19 Age: 47 Y Sex: Female  .  PCP: Emily Salvage MD  Date: 06/29/2018  .  --------------------------------------------------------------------------------  .  REASON FOR APPOINTMENT  .  1. Urinary retention  .  2. Right xanthogranulomatous pyelonephritis  .  3. Staghorn calculus  .  4. Severe hydronephrosis  .  5. Inguinal pain  .  HISTORY OF PRESENT ILLNESS  .  Adult Urology:   This is a 47 year old female with a complex medical history  including, but not limited to anxiety, depression, arthritis,  back problem, migraine, eating disorder, kidney stones,  hypokalemia, otitis media, acute pharyngitis, disorder of kidney  and ureter, UTIs, dysmenorrhea, contact dermatitis, shoulder  joint pain, neck pain, backache, DSTYK, paresthesia, edema,  dyspnea, abdominal pain, right hydronephrosis, staghorn calculus,  Inguinal pain and right xanthogranulomatous pyelonephritis, who  is here today for a follow-up visit.Marland KitchenHer staghorn calculus was  complicated by xanthogranulomatous pyelonephritis with  perinephric abscesses s/p nephrostomy and pigtail placement. She  was recently admitted at [facility] (06/06/18) for about a week  for replacement of abscess drainage tube. She was started on long  term abx for Cefazolin IV. She previously was able to get  infusions at home, but now has to either go to [place] or [facility]. During her [place]. [facility] (06/06/18) for about a week for  replacement of abscess drainage tube. While admitted, she was  found to have >1 L of urinary retention and has been unable to  fully empty her bladder since then. 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 Right kidney takes up approximately  22.3 % of the administered radiopharmaceutical and left kidney  takes up approximately 77.7 %. Evidence of right renal  obstruction..[name] also has a history of a staghorn  calculus. .Renal-Spect in 01/2018 reviewed by Emily [facility] revealed  normal renal cortical imaging of the left kidney, without  evidence of perfusion abnormality.She has a history of  UTI/urosepsis but last episode in April 2018, since then she has  been on home IV antibiotics since July 108. He has been sepsis  since that time. .BUN, Cr, GFR:07/2016: 20, 1.07, 68.08/2016: 12,  1.10, 66.10/2017: 19, 1.13, 63.06/2018: 21, 1.01,  65..PSA:11/2016: 0.69.Marland KitchenUrine Culture:07/2016: No  Growth.08/26/2016: <=10,000 CFU/ml Mixed bacterial  flora.09/16/2016: 50,000-99,000 CFU/ml Group B  Streptococcus09/2018: >=100,000 CFU/ml Group B  Streptococcus04/2018: No growth..She voids with good stream and  good control..She has no other complaints. Patient denies gross  hematuria, urethral discharge, pyuria, dysuria, UTI, fever,  chills, night sweats, weight loss and skeletal pain..  .  Risk Assessment:  Is Patient a Fall Risk?  .  :No  .  .  CURRENT MEDICATIONS  .  Taking Acetaminophen 325 MG Capsule 1 capsule as needed Orally  every 4 hrs  Taking Albuterol  Taking Alprazolam 0.5 MG Tablet 1 tablet Orally PRN TID  Taking CeFAZolin Sodium-NaCl 1-0.9 GM/10ML Solution Prefilled  Syringe as directed Intravenous 2g Q8H  Taking Centrum - Tablet as directed Orally  Taking Coumadin  Taking Ferrous Sulfate 325 (65 Fe) MG Tablet 1  tablet Orally Once  a day  Taking Fioricet 50-300-40 MG Capsule 1 capsule as needed Orally  every 4 hrs  Taking Flomax 0.4 MG Capsule 1 capsule Orally Once a day  Taking Gabapentin 400 mg Capsule 2 capsule Orally Twice a day  Taking Gas-X 80 MG Tablet Chewable 1 tablet after meals and at  bedtime as needed Orally Four times a  day  Taking Hemorrhoidal-HC  Taking Lidocaine 5 % Patch 1 application to affected area as  needed Externally Once a day  Taking Narcan 4 MG/0.1ML Liquid Nasally  Taking Niferex - Tablet as directed Orally  Taking Omeprazole 20 MG Capsule Delayed Release 1 capsule Orally  twice daily  Taking OXYCODONE 5 MG TABLET  Taking Paroxetine HCl 20 MG Tablet 1 tablet in the morning Orally  Once a day  Taking Paxil 20 MG Tablet 1 tablet in the morning Orally Once a  day  Taking Percocet 5-325 MG Tablet 1 tablet as needed Orally every 8  hrs PRN  Taking Polyethylene Glycol 3350 - Powder  Taking Prilosec 10 MG Packet 1 packet 30 minutes before morning  meal Orally Once a day  Taking Proventil  Taking Pulmicort 0.5 MG/2ML Suspension 2 ml Inhalation BID  Taking Senna 8.6 MG Tablet 2 tablets at bedtime as needed Orally  Once a day  Taking Tamsulosin HCl 0.4 MG Capsule 1 capsule Orally Once a day  Taking trazadone 50 mg 1 oral nightly for sleep  Taking Tums (calcium carbonate) 200 mg PRN p.o. twice daily  Taking Valium 5 MG Tablet 1 tablet as needed Orally Q6H  Taking Ventolin HFA  Taking Xanax 0.5 MG Tablet 1 tablet Orally Twice a day  Taking Zofran 4 MG Tablet as directed Orally Q6H PRN  Medication List reviewed and reconciled with the patient  .  PAST MEDICAL HISTORY  .  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  ALLERGIES  .  Toradol  .  SURGICAL HISTORY  .  Appendectomy 1986  Casearean section 1989  Ectopic pregnancy surgery 2001  .  FAMILY  HISTORY  .  Mother: deceased  Father: alive, diagnosed with Heart Disease  2 brother(s) , 2 sister(s) . 2 son(s) , 4 daughter(s) .  Mother died of scolerosis of the liver in 34. Diabetes paternal  grandmother.  .  SOCIAL HISTORY  .  [name]  Lives with: 6 kids and grandchildren.  .  Alcohol Denies.  .  Illicit drugs: Never.  .  Caffeine: 4 cups of coffee.  .  Tobaccohistory:Currently smoking Pack Year History: Started at 13  years until 1/2 pack per year .  [name]  HOSPITALIZATION/MAJOR DIAGNOSTIC PROCEDURE  .  Sepsis ( August-November-December- February) 2017-2018  .  REVIEW OF SYSTEMS  .  Urology ROS:  .  Constitutional:    No fevers/chills/weight loss or general  weakness . Eyes:    No decreased visual acuity, loss of vision or  diplopia . Integumentary:    No rash or skin  lesions, No changes  in hair or nail character . Lungs:    no shortness of breath, new  frequent cough, no history of asthma . Cardiovascular:    No  chest pain, palpitations . Gastrointestinal:    No abdominal  pain/nausea/vomiting/bowel changes . Genitourinary:    chronic  foley d/t retention. Has a nephorsotomy tube and abcess drainage  tube.  . Musculoskeletal:    No joint\muscle pain, decreased  mobility or joint swelling . Neurological:    No headache,  dizziness, seizures, light headedness, memory loss or numbness .  Psychiatry:    No mood/behavioral changes, anxiety or depression  . Endocrine:    No hirsutism or excessive hair loss, polyuria,  polydipsia or alopecia . Hematologic/Lymphatic:    No  lymphadenopathy, easy bruising or abnormal bleeding .  Allergic/Immunologic:    No environmental or food allergies .  [name]  VITAL SIGNS  .  Pain scale 9, Ht-in 61.50, Wt-lbs 158, BMI 29.37, BP 129/74, HR  101, BSA 1.76, Ht-cm 156.21, Wt-kg 71.67, Wt Change -.8 lb.  .  PHYSICAL EXAMINATION  .  Urology PE:  General Appearance:  well-developed, well nourished, NAD, normal  secondary sexual characteristics. HEENT:  normocephalic,  atraumatic, PERRLA, EOMI,  anicteric, no nasal discharge, sinuses  non-tender, oral pharynx within normal limits. Skin:  no bruises,  no petechiae, no rashes or lesions. Neck:  supple, no masses,  trachea in midline, normal range of motion. Chest  normal AP  diameter, no rib tenderness. Lungs:  normal respirations,  symmetric excursion with no accesory muscle use. Back:  no CVAT,  vertebral column aligned, no sacral Vantage or dimples, no  scoliosis/kyphosis, masses or tenderness. Cardiovascular:  RRR,  no murmur, pulses intact. Abdomen:  Has a right nephorsotomy tube  and abcess drainage tube in place. Abdomen globular obese, soft,  benign, non-tender, non-distended, no palpable masses, no  hepato-splenomegaly, no hernias, bladder not palpable.  Extremities:  no clubbing, cyanosis or pitting edema.  Musculo-Skeletal:  no muscle wasting, joint swelling or  tenderness. Lymphatic:  no cervical, axillary or inguinal  adenopathy. Neurologic:  oriented x3, no focal deficits.  GU Female:  Genitalia:  Normally developed female genitalia. Pelvic exam:   Deferred as per patient request.  .  ASSESSMENTS  .  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 Her staghorn calculus/proteus  c/b xanthogranulomatous pyelonephritis with perinephric abscesses  s/p nephrostomy and pigtail placement  .  Hydronephrosis, right - N13.30, Secondary to Staghorn calculus  .  Inguinal pain, unspecified laterality - R10.30  .  TREATMENT  .  Emphysematous pyelonephritis of right kidney  LAB: Urine Dip POC  2+ Le, Negative for NIT and Blood.  Notes: Long discussion regarding plan of management, do agree he  requires nephrectomy to manage recurring UTIs and abscess. He has  discussed with local urology (Dr. Merilyn Baba), will decide how she  wants to proceed. Prior to proceeding with surgical option will  need clearance from neuro with regards to cerebral aneurysm .  .  .  Others  Notes:  RTC pending his decision on how he proceeds with surgery  Due to the complexity of her condition Dr Dorothea Ford spent 50 minutes  with patient reviewing her medical history and for management,  treatment, counselling, and coordination of care for her  condition. 50% of this time was counselling the patient. .  .  PREVENTIVE MEDICINE  .  Counseling:  Smoking   . BMI Management   .  [name]  PROCEDURE CODES  .  [id number] URO MD URINALYSIS AUTO W/O MICROSCOPY  .  FOLLOW UP  .  RTC, prn  .  Electronically signed by Emily Ford , MD on  07/05/2018 at 06:43 PM EST  .  Document electronically signed by Emily Ford    .

## 2018-06-29 NOTE — Progress Notes (Signed)
.  Progress Notes  .  Patient: Emily Ford  Provider: Heloise Ochoa  PA  .DOB: 11/09/1970 Age: 47 Y Sex: Female  Supervising Provider:: Prudy Feeler MD  Date: 06/29/2018  .  PCP: Adah Salvage MD  Date: 06/29/2018  .  --------------------------------------------------------------------------------  .  REASON FOR APPOINTMENT  .  1. Follow-up for xanthogranulomatous pyelonephritis  .  HISTORY OF PRESENT ILLNESS  .  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 for evaluation of renal function given his  tentative plan for nephrectomy. She is on chronic IV antibiotics  (Cefazolin). She previously was able to get infusions at home,  but now has to either go to [place] or [facility]. She was  recently admitted at [facility]. Leane Call (06/06/18) for about a week for  replacement of abcess drainage tube. While admitted, she was  found to have >1L of urinary rentention and has been unable to  fully empty her bladder since then. She currently has a chronic  foley. There is no concrete timeline in regards to nephrectomy of  her right kidney. She has an appointment at 1:30 today for  follow-up with [name]. Patient reports chronic lumbar pain  due to degenerative disc disease and regularly visits a pain  clinic for symptom management. She also reports SOB, which she  has at baseline due to COPD and asthma. She otherwise denies  gross hematuria, urethral discharge, pyuria, dysuria, fever,  chills, chest pain, N/V/D, lower extremity swelling, or  dizziness/lightheadedness.  [name]  PAST IMAGING:   Split Function Renal Scan (01/2018):IMPRESSION: Normal renal  cortical imaging of the left kidney, without evidence  of  perfusion abnormality.NM Renal Scan (03/2016)Report: right kidney  22.3% uptake and left kidney with approximately 77.7%.  .  Associated Providers:   PCP: Dr. Payton Spark KapogiannisforUrology: recently referred to  [name], prior urologist Dr. Richardean Canal.  .  CURRENT MEDICATIONS  .  Taking Acetaminophen 325 MG Capsule 1 capsule as needed Orally  every 4 hrs  Taking Alprazolam 0.5 MG Tablet 1 tablet Orally PRN TID  Taking Bisac-Evac 10 MG Suppository 1 suppository as needed  Rectal Once a day  Taking CeFAZolin Sodium-NaCl 1-0.9 GM/10ML Solution Prefilled  Syringe as directed Intravenous 2g Q8H  Taking Coumadin  Taking Dulera 200-5 MCG/ACT Aerosol 2 puffs Inhalation Twice a  day  Taking Fioricet 50-325-40 MG Tablet 1 tablet as needed Orally  every 4 hrs  Taking Gabapentin 400 mg Capsule 2 capsule Orally Twice a day  Taking Lidocaine 5 % Patch 1 application to affected area as  needed Externally Once a day  Taking Narcan 4 MG/0.1ML Liquid Nasally  Taking Omeprazole 20 MG Capsule Delayed Release 1 capsule Orally  twice daily  Taking Paroxetine HCl 20 MG Tablet 1 tablet in the morning Orally  Once a day  Taking Percocet 5-325 MG Tablet 1 tablet as needed Orally every 8  hrs PRN  Taking Polyethylene Glycol 3350 - Powder  Taking Pulmicort 0.5 MG/2ML Suspension 2 ml Inhalation BID  Taking Senna 8.6 MG Tablet 2 tablets at bedtime as needed Orally  Once a day  Taking Simethicone-80 80 MG Tablet Chewable 1 tablet after meals  and at bedtime as needed Orally Four times a day  Taking  Tamsulosin HCl 0.4 MG Capsule 1 capsule Orally Once a day  Taking trazadone 50 mg 1 oral nightly for sleep  Taking Tums (calcium carbonate) 200 mg PRN p.o. twice daily  Taking Valium 5 MG Tablet 1 tablet as needed Orally Q6H  Taking Ventolin HFA  Taking Xarelto 10 MG Tablet 1 tablet with food Orally Once a day  Taking Zofran 4 MG Tablet as directed Orally Q6H PRN  Not-Taking/PRN Ferrous Sulfate 325 (65 Fe) MG Tablet 1 tablet  Orally Once a  day  Medication List reviewed and reconciled with the patient  .  PAST MEDICAL HISTORY  .  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  ALLERGIES  .  Toradol  .  SURGICAL HISTORY  .  Appendectomy 1986  Casearean section 1989  Ectopic pregnancy surgery 2001  .  FAMILY HISTORY  .  Mother: deceased  Father: alive, diagnosed with Heart Disease  2 brother(s) , 2 sister(s) . 2 son(s) , 4 daughter(s) .  Mother died of scolerosis of the liver in 91. Diabetes paternal  grandmother.  .  SOCIAL HISTORY  .  [name]  Lives with: 6 kids and grandchildren.  .  .  Alcohol  Denies  .  [name]  Illicit drugs: Never.  .  .  Caffeine: 4 cups of coffee.  .  .  Tobacco  history:Currently smoking Pack Year History: Started at 13 years  until 1/2 pack per year  .  HOSPITALIZATION/MAJOR DIAGNOSTIC PROCEDURE  .  Sepsis ( August-November-December- February) 2017-2018  .  REVIEW OF SYSTEMS  .  Nephrology:  .  CONSTITUTIONAL:    Denies unintentional weight change, fatigue,  fever, chills.  [name] EYES:    Denies recent visual changes  . HENT:     Reports migraines. Denies auditory changes, metallic taste,  dysphagia . HEART:    Denies chest pain, palpitations, edema .  GASTROINTESTINAL:    Reports nausea and constipation. Denies  vomiting, diarrhea, abdominal pain, blood in stools .  RESPIRATORY:    Reports chronic SOB (at baseline), denies cough .  GENITOURINARY:    Reports difficulty urinating. Has chronic foley  in place. Denies gross hematuria or pyuria.  [name] MUSCULOSKELETAL:     Reports chronic lumbar  pain, left sided sciatic pain with  associated left leg weakness.  . INTEGUMENTARY (SKIN AND/OR  BREAST):    Denies rashes, non healing wounds, change in moles,  any new or abnormal lesions . ENDOCRINE:    Denies increased  thirst or urination, intolerance to heat or cold .  HEMATOLOGIC/LYMPHATIC:    Denies easy bruising, swollen or tender  glands . PSYCHIATRIC:    Reports anxiety. [name] NEUROLOGICAL SYSTEM:     Denies dizziness, numbness, tingling, change in mental status .  [name]  VITAL SIGNS  .  Ht-in 61.50, Wt-lbs 158.8, BMI 29.52, BP 111/77, HR 105, BSA  1.77, Ht-cm 156.21, Wt-kg 72.03, Wt Change 3.4 lb.  .  PHYSICAL EXAMINATION  .  NEPHROLOGY:  CONSTITUTIONAL / VITALS:  Appears well and in no apparent  distress.  HEENT:  Eyes with no pallor or icterus. Oropharynx clear and oral  mucous membrane moist.  CVS:  JVD not elevated. No carotid bruit. S1 and S2 normal,  tachycardic and rhythm. No murmur, rub or gallop. No lower  extremity edema.  LUNGS:  Clear to auscultation bilaterally. No wheezes, rales, or  rhonchi. .  ABDOMEN:  Right flank with 2 tubes: nephrostomy and abscess  drainage. Abdomen distended, non-tender no CVA tenderness. Foley  catheter in place.[name]  EXTREMITIES:  No lower extremity edema. [name]  SKIN:  No pallor, icterus, bruise or rash.  NEURO:  Alert and oriented to time, place and person. No  asterixis.  MSK:  No joint swelling.  .  ASSESSMENTS  .  LABS - See Below (Primary)  .  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  In summary, Ilee is a 47 year old female, with CKD stage  G2A3 with a complex medical history including anxiety,  depression, previous eating disorder, recurrent UTIs 2/2 staghorn  calculus/proteus complicated by xanthogranulomatous  pyelonephritis with perinephric abscesses s/o nephrostomy and  pigtail placement on chronic antibiotics. Today's eGFR of  67ml/min/1.73m2 with creatinine of 1.01mg /dL and UACR   1g. Recent  nuclear medicine renal scan showed that her right kidney has  lost  complete functionality. Based on current regimen of chronic  antibiotics, PICC line in place on Xarelto, it is our  recommendation that she move forward with nephrectomy.  .  TREATMENT  .  LABS  LAB: Albumin (ALB)  Albumin     4.2     (3.4 - 4.8 - g/dL)  .  [name]  LAB: Magnesium (MG)  Magnesium     1.7     (1.6 - 2.6 - mg/dL)  .  [name]  LAB: Calcium (Ca)  Calcium (Ca)     9.6     (8.5 - 10.5 - mg/dL)  .  [name]  LAB: Glucose (GLU)  Glucose     82     (70 - 139 - mg/dL)  .  [name]  LAB: Phosphorus (PHOS)  Phosphorus     2.8     (2.7 - 4.5 - mg/dL)  .  [name]  LAB: Blood Urea Nitrogen (BUN)  Blood Urea Nitrogen     21     (6 - 24 - mg/dL)  .  [name]  LAB: Albumin, random urine w/ creat (UALB)  Albumin Random, Urine     36.8     ( - mg/dL)  Microalbumin Calc, Urine     1.02     ( - mg/mg)  Creatinine Random, Urine     35.91     ( - mg/dL)  Albumin/Creatinine Ratio, Urine     1025     (0 - 30 - mg/g)  .  [name]  LAB: Protein/Creatinine, Random Urine (UPROT)  Protein, Random Urine (includes Creatinine)     64     (0 - 15 -  mg/dL)  Creatinine Random, Urine     35.91     ( - mg/dL)  Protein/Creatinine Ratio, Urine     1782     (0 - 200 - mg/g)  .  [name]  LAB: CBC/DIFF with PLT (CBCWD)  WBC     7.9     (4.0 - 11.0 - K/uL)  RBC     4.24     (3.70 - 5.00 - M/uL)  HGB     13.5     (  11.0 - 15.0 - g/dL)  HCT     16.1     (09.6 - 45.0 - %)  MCV     99.5     (80.0 - 98.0 - fL)  MCH     31.8     (26.0 - 34.0 - pg)  MCHC     32.0     (32.0 - 36.0 - g/dL)  RDW     04.5     (40.9 - 14.5 - %)  PLT     239     (150 - 400 - K/uL)  MPV     10.1     (9.1 - 11.7 - fL)  SEG NEUT     71     ( - %)  LYMPH     18     ( - %)  MONO     7     ( - %)  EOS     3     ( - %)  BASO     0     ( - %)  NEUT #     5.6     (1.5 - 7.5 - K/uL)  LYMPH #     1.5     (1.0 - 4.0 - K/uL)  MONO #     0.5     (0.2 - 0.8 - K/uL)  EOSIN #     0.2     (0.0 - 0.5 - K/uL)  BASO #     0.0     (0.0 - 0.2 - K/uL)  Imm Grnas     0     ( - %)  NRBC     0     ( - %)  Imm Grans, Abs     0.0     (0.0 - 0.1 -  K/uL)  NRBC, Abs     0.0     (<0.0 - K/uL)  .  [name]  LAB: Electrolytes (Na, K, Cl, CO2) LYTES  ANION GAP     7     (3 - 14 - )  CL     103     (98 - 110 - mEq/L)  CO2     28     (20 - 30 - mEq/L)  K     4.7     (3.6 - 5.1 - mEq/L)  NA     138     (135 - 145 - mEq/L)  .  [name]  LAB: Creatinine (CR)  Creatinine (CR)     1.01     (0.57 - 1.30 - mg/dL)  .  [name]  LAB: KBPC Urinalysis  SG     1.005     ()  pH     6.5     ()  LEU     +2     ()  NIT     neg     ()  PRO     +1     ()  GLU     norm     ()  KET     neg     ()  UBG     norm     ()  BIL     neg     ()  BLD     +1     ()  .  [name]  LAB: GFR, AA  GFR, AA     75     (>  60 - mL/min/1.33m2)  .  [name]  LAB: GFR, NAA  GFR, NAA     65     (>60 - mL/min/1.64m2)  .  [name]  Xanthogranulomatous pyelonephritis  Clinical Notes: - current eGFR is representative of left kidney  function. 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 - eGFR today 56ml/min/1.73m2 with creatinine of  1.01mg /dL- UACR   1g, not on ACEi. - BP <130/80. Based on prior  hospital visits, and BP taken at home, BP runs on the lower side:  100-115/60-70s. - will trend proteinuria and BP.- at this point,  chronic infection and need for nephrectomy supersedes  proteinuria. - Will continue to monitor.  .  .  Others  Notes:  .  - chronic abx; referred to ID for evaluation of abx and for hopes  that she can receive treatment at home.  .  .  Clinical Notes:  .  ATTENDING ATTESTATION: I personally interviewed and examined the  patient and reviewed PA Stubinski's note. I agree with the  history, examination, assessment and plan as she sets them forth.  ATTENDING NOTE:47 year old woman with xanthogranulomatous  pyelonephritis on the right, last seen many months ago. She is a  non-linear historian. As best we can tell, she continues to be on  IV antibiotics 3 times a day, though it is unclear if this is  continuous or intermittent courses. Has not been able to get in  antibiotics at home, so is living at [place] (?) and  going to [facility] for antibiotics. Still has purulent drainage from her  nephrostomy tube. Also needs to get nephrostomy placed  periodically, including just a few days ago when it fell out. She  seems a bit unclear about the role of the various members of her  health care team: Thinks I am prescribing her antibiotics, and  interventional radiology is considering taking her kidney out-1  fact I am not prescribing her antibiotics and urology is the  potential service to do nephrectomy.  .  Her primary goal is to go home. She is unable to do so I believe  because of her IV antibiotics. She also continues to have a PICC,  which ultimately will cause an intravascular infection. She is  intermittently on Xarelto for what sounds like line-associated  clots, which also could potentially be avoided if she was no  longer on antibiotics. Given absence of function of the right  kidney on split testing, I see no reason the kidney should not  come out ASAP, as the current plan is going to lead to disaster.  We may refer to ID to try to straighten out this bizarre  antibiotic plan, though as her story unfolds I worry that much of  this is logistic rather medical complications. ADDENDUM: As we  gain more information, I think she may have exhausted her home  antibiotic options which is why she is getting them at [facility]. I have contacted Dr. the longer expressed my opinion that  surgery appears to the only option to get her out of her current  difficult situation. There is another urologist involved  complicating matters I believe. We also found today that she has  proteinuria, and a sample which we believe is from her Foley;  unclear if this could be drainage from her scarred kidney versus  some sort of involvement of her (so far) adequately-functioning  left kidney. 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 Further comments  per PA Stubinski, above..  .  PROCEDURE CODES  .  [id number] URINALYSIS TEST PROCEDURE  .  FOLLOW UP  .  6 Months  .  [name]  Appointment Provider: Heloise Ochoa, PA  .  Electronically signed by Prudy Feeler MD on  07/02/2018 at 06:03 PM EST  .  CONFIRMATORY SIGN OFF  .  [name]  Document electronically signed by Heloise Ochoa  PA  .

## 2018-06-30 ENCOUNTER — Ambulatory Visit

## 2018-07-03 ENCOUNTER — Ambulatory Visit

## 2018-07-10 ENCOUNTER — Ambulatory Visit: Admitting: Internal Medicine

## 2018-07-10 NOTE — Progress Notes (Signed)
* * *        **  [name]**    --- ---    69 Y old Female, DOB: 11/07/1970    [name] [place], [place] [address]    Home: [phone]    Provider: Prudy Feeler, MD        * * *    Telephone Encounter    ---    Answered by   [name]  Date: 07/10/2018         Time: 10:49 AM    Jethro Bolus   Tresa Endo from pt PCP office    --- ---            Reason   Fax office notes            Message                      Tresa Endo is requesting office visit notes for 06/29/18. Office fax number is [phone]. If you have any questions, office telephone is [phone]            [name]                Action Taken                      [name],[name]  07/10/2018 10:51:15 AM >                  [place] , [place] 07/10/2018 11:02:01 AM > sent                     * * *                ---          * * *          Patient: [name] DOB: 1971-03-22 Provider: Prudy Feeler, MD  07/10/2018    ---    Note generated by eClinicalWorks EMR/PM Software ([email or web address])

## 2018-07-16 ENCOUNTER — Ambulatory Visit: Admit: 2018-07-16 | Payer: No Typology Code available for payment source

## 2018-07-16 ENCOUNTER — Ambulatory Visit

## 2018-07-16 ENCOUNTER — Ambulatory Visit: Admitting: Infectious Disease

## 2018-07-16 NOTE — Progress Notes (Signed)
.  Progress Notes  .  Patient: Emily Ford  Provider: Hermine Messick    .  DOB: 1970/11/13 Age: 47 Y Sex: Female  .  PCP: Adah Salvage MD  Date: 07/16/2018  .  --------------------------------------------------------------------------------  .  REASON FOR APPOINTMENT  .  1. NP KIDNEY STONES  .  HISTORY OF PRESENT ILLNESS  .  GENERAL:   Patient is a 47 yo woman with a pmhx of anxiety, depression,  opiate dependence. She is a wheel chair because of sciatica and  djd. She is presenting for consultation regarding  Xanthogranulomatous pyelonephritis due to a retained staghorn  stone, complicated by renal and psoas abscess over that same R  side. She had grown GBS, proteus, and e. coli. She is on long  term IV cefazolin via picc. This was started at [facility]  hospital in [place]. She has a foley in place for retention, a  R sided nephrostomy tube in place because of hydro, and a R side  percutaneous drain in place because of her abscess. The  Nephrostomy tube was changed yesterday, and is changed at [facility].  [facility] every two months. The abscess drain was changed yesterday  as well. She is planning on nephrectomy in the near future to  address the R. sided kidney infection, but she needs neurosurgery  to weigh in on her case as she has a cerebral aneurysm that is  slowly increasing in size over time.She brings in countless sheet  of records (>50) but none of them have any imaging over the last  two months. With this it is difficult to assure she has current  source control as in adequate abscess drainage with the drain in  place. Also with her countless records she also has no culture  data with her as well, but she seems clinically stable on IV  cefazolin so likely all isolates have been susceptible.Patient  explains that over the years when she has been off of antibiotics  she will have recurrent sepsis w/in a few weeks and be back in  the hospital. with the last two months of IV cefazolin she  has  remained at rehab with no fevers, chills, or clinical decline.She  is reports at baseline she has 9-10/10 over the tube sides over  her R side but this is chronic and not new. She is enrolled at a  pain clinic when outpatient and currently in the rehab center is  getting narcotics scheduledFrom Urology's note here is the some  of the culture data they have receivedUrine Culture: 07/2016: No  Growth. 08/26/2016: <=10,000 CFU/ml Mixed bacterial flora.  09/16/2016: 50,000-99,000 CFU/ml Group B Streptococcus 04/2017:  >=100,000 CFU/ml Group B Streptococcus 11/2016: No growth.  .  CURRENT MEDICATIONS  .  Taking Acetaminophen 325 MG Capsule 1 capsule as needed Orally  every 4 hrs  Taking Albuterol  Taking Alprazolam 0.5 MG Tablet 1 tablet Orally PRN TID  Taking CeFAZolin Sodium-NaCl 1-0.9 GM/10ML Solution Prefilled  Syringe as directed Intravenous 2g Q8H  Taking Centrum - Tablet as directed Orally  Taking Coumadin  Taking Ferrous Sulfate 325 (65 Fe) MG Tablet 1 tablet Orally Once  a day  Taking Fioricet 50-300-40 MG Capsule 1 capsule as needed Orally  every 4 hrs  Taking Flomax 0.4 MG Capsule 1 capsule Orally Once a day  Taking Gabapentin 400 mg Capsule 2 capsule Orally Twice a day  Taking Gas-X 80 MG Tablet Chewable 1 tablet after meals and at  bedtime as needed  Orally Four times a day  Taking Hemorrhoidal-HC  Taking Lidocaine 5 % Patch 1 application to affected area as  needed Externally Once a day  Taking Narcan 4 MG/0.1ML Liquid Nasally  Taking Niferex - Tablet as directed Orally  Taking Omeprazole 20 MG Capsule Delayed Release 1 capsule Orally  twice daily  Taking OXYCODONE 5 MG TABLET  Taking Paroxetine HCl 20 MG Tablet 1 tablet in the morning Orally  Once a day  Taking Paxil 20 MG Tablet 1 tablet in the morning Orally Once a  day  Taking Percocet 5-325 MG Tablet 1 tablet as needed Orally every 8  hrs PRN  Taking Polyethylene Glycol 3350 - Powder  Taking Prilosec 10 MG Packet 1 packet 30 minutes before  morning  meal Orally Once a day  Taking Proventil  Taking Pulmicort 0.5 MG/2ML Suspension 2 ml Inhalation BID  Taking Senna 8.6 MG Tablet 2 tablets at bedtime as needed Orally  Once a day  Taking Tamsulosin HCl 0.4 MG Capsule 1 capsule Orally Once a day  Taking trazadone 50 mg 1 oral nightly for sleep  Taking Tums (calcium carbonate) 200 mg PRN p.o. twice daily  Taking Valium 5 MG Tablet 1 tablet as needed Orally Q6H  Taking Ventolin HFA  Taking Xanax 0.5 MG Tablet 1 tablet Orally Twice a day  Taking Zofran 4 MG Tablet as directed Orally Q6H PRN  .  ALLERGIES  .  yes[Allergies Verified]  .  VITAL SIGNS  .  Pain scale 7, Ht-in 61.50, Wt-lbs wc, BP 122/83, HR 113, RR 16,  Temp 97.1, Oxygen sat % 94RA.  [name]  PHYSICAL EXAMINATION  .  Infectious Disease:  General   appears well, no distress, but is in a wheel chair.  HEENT:  head atraumatic, pupils anicteric, noninjected, PERRL,  EOMI, oropharynx normal, no erythema, no thrush, no ulcerations.  Neck:  supple, no lymphahadenopathy.  Lungs:  clear bilaterally.  Back:  over the R flank area she has two drains, one in her  perinephric abscess and the other is her urostomy tube. Patient  is expresses pain over the R CVA area but doesn't actually  grimace or stop talking during the exam. The two drains show  cloudy output the urostomy tube is slight less opaque.  Heart:  RRR, no murmurs, no gallops, no rubs.  Abdomen:  soft, non-tender, non-distended, no HSM, bowel sounds  present.  Extremities:  no clubbing, no cyanosis, no edema.  Neurology:  grossly nonfocal.  Dermatology:  no rash, no stigmata of endocarditis.  Psychology:  patient is very animated and talks at length  tangentially. .  Lymph Nodes  no cervical, no axillary, no inguinal .  .  ASSESSMENTS  .  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  Perinephric abscess - N15.1  .  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  Patient is a 47 yo woman with a pmhx of recurrent UTI due to  staghorn calculous and now emphysematous pyelonephritis with  perinephric abscess and psoas abscess. She has very little  residual uop from this R. sided kidney and plan will be for  nephrectomy in the near future after her cerebral aneurysms has  been re-evaluated by neurosurgery. She is currently on IV  cefazolin and is clinically stable. I presuming this  is the  correct spectrum of antibiotic coverage given her clinical  stability. I do not have culture data to review. I do want to  assure that patient has adequate abscess drainage at the moment.  I sent her for renal US to determine if there were any obvious  residual pockets that were not being addressed with her current  percutaneous drain. But the renal US was poor quality given the  stone created artifact and obscured our ability to evaluated for  abscess. Instead now I have called [facility] department of IR to  discuss with the MD who did her drain and nephrostomy replacement  on 07/15/18. I left my cell. She or He could likely tell me if we  have adequate drainage placement or not. If I can determine that  indeed patient's abscess are being address I recommend stopping  the IV cefazolin and going out on oral Keflex instead. Keflex get  adequate urinary penetration and soft tissue penetration and will  be sufficient. Prolonged IV antibiotics come with risk over time  that are unnecessary in her case if we know she had adequate  abscess control. She should be on antibiotics until she had her  nephrectomy. And I want to see her to get a urine cultures just  prior to surgery to sterilize the area as best as possible prior  to instrumentation.Will communicate this patient with Urology.  Will need to see surgical planning before I can schedule ID  follow up.  .  .  Appointment  Provider: Hermine Messick, MD  .  Electronically signed by Shayne Alken , MD on  07/17/2018 at 10:33 AM EST  .  Document electronically signed by Hermine Messick    .

## 2018-07-16 NOTE — Progress Notes (Signed)
* * *        Emily Ford**    --- ---    47 Y old Female, DOB: 08-14-1971, External MRN: [id number]    Account Number: [id number]    [address], [place], [id number]    Home: [phone]    Insurance: [id number] [employer] [facility]    PCP: Adah Salvage, MD Referring: Prudy Feeler    Appointment Facility: Infectious Disease        * * *    07/16/2018   **Appointment Provider:** Hermine Messick, MD **CHN#:** [id number]    --- ---    ---         **Reason for Appointment**    ---       1\. NP KIDNEY STONES    ---       **History of Present Illness**    ---     _GENERAL_ :    Patient is a 47 yo woman with a pmhx of anxiety, depression, opiate  dependence. 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    She had grown GBS, proteus, and e. coli. She is on long term IV cefazolin via  picc. This was started at [facility] hospital in [place]. She has a foley in  place for retention, a R sided nephrostomy tube in place because of hydro, and  a R side percutaneous drain in place because of her abscess.    The Nephrostomy tube was changed yesterday, and is changed at [facility]. Leane Call every  two months. The abscess drain was changed yesterday as well.    She is planning on nephrectomy in the near future to address the R. sided  kidney infection, but she needs neurosurgery to weigh in on her case as she  has a cerebral aneurysm that is slowly increasing in size over time.    She brings in countless sheet of records (>50) but none of them have any  imaging over the last two months. With this it is difficult to assure she has  current source control as in adequate abscess drainage with the drain in  place. Also with her countless records she also has no culture data with her  as well, but she seems clinically stable on IV cefazolin so likely  all  isolates have been susceptible.    Patient explains that over the years when she has been off of antibiotics she  will have recurrent sepsis w/in a few weeks and be back in the hospital. with  the last two months of IV cefazolin she has remained at rehab with no fevers,  chills, or clinical decline.    She is reports at baseline she has 9-10/10 over the tube sides over her R side  but this is chronic and not new. She is enrolled at a pain clinic when  outpatient and currently in the rehab center is getting narcotics scheduled    From Urology's note here is the some of the culture data they have received    Urine Culture:    07/2016: No Growth.    08/26/2016: <=10,000 CFU/ml Mixed bacterial flora.    09/16/2016: 50,000-99,000 CFU/ml Group B Streptococcus    04/2017: >=100,000 CFU/ml Group B Streptococcus    11/2016: No growth.       **Current Medications**    ---  Taking     * Acetaminophen 325 MG Capsule 1 capsule as needed Orally every 4 hrs    ---    * Albuterol     ---    * Alprazolam 0.5 MG Tablet 1 tablet Orally PRN TID    ---    * CeFAZolin Sodium-NaCl 1-0.9 GM/10ML Solution Prefilled Syringe as directed Intravenous 2g Q8H    ---    * Centrum - Tablet as directed Orally     ---    * Coumadin     ---    * Ferrous Sulfate 325 (65 Fe) MG Tablet 1 tablet Orally Once a day    ---    * Fioricet 50-300-40 MG Capsule 1 capsule as needed Orally every 4 hrs    ---    * Flomax 0.4 MG Capsule 1 capsule Orally Once a day    ---    * Gabapentin 400 mg Capsule 2 capsule Orally Twice a day    ---    * Gas-X 80 MG Tablet Chewable 1 tablet after meals and at bedtime as needed Orally Four times a day    ---    * Hemorrhoidal-HC     ---    * Lidocaine 5 % Patch 1 application to affected area as needed Externally Once a day    ---    * Narcan 4 MG/0.1ML Liquid Nasally     ---    * Niferex - Tablet as directed Orally     ---    * Omeprazole 20 MG Capsule Delayed Release 1 capsule Orally twice daily    ---    * OXYCODONE 5 MG  TABLET     ---    * Paroxetine HCl 20 MG Tablet 1 tablet in the morning Orally Once a day    ---    * Paxil 20 MG Tablet 1 tablet in the morning Orally Once a day    ---    * Percocet 5-325 MG Tablet 1 tablet as needed Orally every 8 hrs PRN    ---    * Polyethylene Glycol 3350 - Powder     ---    * Prilosec 10 MG Packet 1 packet 30 minutes before morning meal Orally Once a day    ---    * Proventil     ---    * Pulmicort 0.5 MG/2ML Suspension 2 ml Inhalation BID    ---    * Senna 8.6 MG Tablet 2 tablets at bedtime as needed Orally Once a day    ---    * Tamsulosin HCl 0.4 MG Capsule 1 capsule Orally Once a day    ---    * trazadone 50 mg 1 oral nightly for sleep    ---    * Tums (calcium carbonate) 200 mg PRN p.o. twice daily    ---    * Valium 5 MG Tablet 1 tablet as needed Orally Q6H    ---    * Ventolin HFA     ---    * Xanax 0.5 MG Tablet 1 tablet Orally Twice a day    ---    * Zofran 4 MG Tablet as directed Orally Q6H PRN    ---       **Vital Signs**    ---    Pain scale 7, Ht-in 61.50, Wt-lbs wc, BP 122/83, HR 113, RR 16, Temp 97.1,  Oxygen sat % 94RA.       **Physical  Examination**    ---     _Infectious Disease_ :    General appears well, no distress, but is in a wheel chair.    HEENT: head atraumatic, pupils anicteric, noninjected, PERRL, EOMI, oropharynx  normal, no erythema, no thrush, no ulcerations.    Neck: supple, no lymphahadenopathy.    Lungs: clear bilaterally.    Back: over the R flank area she has two drains, one in her perinephric abscess  and the other is her urostomy tube. Patient is expresses pain over the R CVA  area but doesn't actually grimace or stop talking during the exam. The two  drains show cloudy output the urostomy tube is slight less opaque.    Heart: RRR, no murmurs, no gallops, no rubs.    Abdomen: soft, non-tender, non-distended, no HSM, bowel sounds present.    Extremities: no clubbing, no cyanosis, no edema.    Neurology: grossly nonfocal.    Dermatology: no rash, no  stigmata of endocarditis.    Psychology: patient is very animated and talks at length tangentially. .    Lymph Nodes no cervical, no axillary, no inguinal .          **Assessments**    ---    1\. 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 Perinephric abscess - N15.1    ---    3\. 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 She has very little residual uop from this R. sided kidney and  plan will be for nephrectomy in the near future after her cerebral aneurysms  has been re-evaluated by neurosurgery.    She is currently on IV cefazolin and is clinically stable. I presuming this is  the correct spectrum of antibiotic coverage given her clinical stability. I do  not have culture data to review.    I do want to assure that patient has adequate abscess drainage at the moment.  I sent her for renal US to determine if there were any obvious residual  pockets that were not being addressed with her current percutaneous drain. But  the renal US was poor quality given the stone created artifact and obscured  our ability to evaluated for abscess.    Instead now I have called [facility] department of IR to discuss with the MD  who did her drain and nephrostomy replacement on 07/15/18. I left my cell. She  or He could likely tell me if we have adequate drainage placement or not.    If I can determine that indeed patient's abscess are being address I recommend  stopping the IV cefazolin and going out on oral Keflex instead. Keflex get  adequate urinary penetration and soft tissue penetration and will be  sufficient.  Prolonged IV antibiotics come with risk over time that are  unnecessary in her case if we know she had adequate abscess control. She  should be on antibiotics until she had her nephrectomy. And I want to see her  to get a urine cultures just prior to surgery to sterilize the area  as best as  possible prior to instrumentation.    Will communicate this patient with Urology. Will need to see surgical planning  before I can schedule ID follow up.    ---    **Appointment Provider:** Hermine Messick, MD    Electronically signed by Shayne Alken , MD on 07/17/2018 at 10:33 AM EST    Sign off status: Completed        * * *        Infectious Disease    [address] [place], [address]    [place], [place] [address]    Tel: [phone]    Fax: [phone]              * * *          Patient: Emily Ford, Emily Ford DOB: 11/18/1970 Progress Note: Hermine Messick, MD  07/16/2018    ---    Note generated by eClinicalWorks EMR/PM Software ([email or web address])

## 2018-07-20 ENCOUNTER — Ambulatory Visit

## 2018-08-17 ENCOUNTER — Ambulatory Visit: Admitting: Urology

## 2018-08-17 NOTE — Progress Notes (Signed)
* * *        **  Emily Ford**    --- ---    26 Y old Female, DOB: 10/29/1970    [address] [name] [place], [place] [address]    Home: [phone]    Provider: [name]        * * *    Telephone Encounter    ---    Answered by   Dale Durham  Date: 08/17/2018         Time: 09:50 AM    Caller   Lisabeth Pick ( [facility]NA Nurse )    --- ---            Message                      Called in stating [name] was dischargded from  her faciltiy with an indwelling Foley and had some concerns, she is a Critical care nurse and will do a VT on the patient but can you follow-up with her just in case the pt needs a follow-up with [name]. Best contact info is Lisabeth Pick [phone] ).                Action Taken                      Douglas,Carolyn A 08/17/2018 9:53:27 AM >      called patient x2 today , child answered  patient was out , I tried again later , no answer      Goldrick,Kathryn  08/21/2018 1:15:52 PM >                    * * *                ---          * * *          Patient: Emily Ford DOB: 02/04/1971 Provider: [name] 08/17/2018    ---    Note generated by eClinicalWorks EMR/PM Software ([email or web address])

## 2018-09-23 ENCOUNTER — Ambulatory Visit: Admitting: Internal Medicine

## 2018-09-23 NOTE — Progress Notes (Signed)
* * *      **  Emily Ford**    ------    62 Y old Female, DOB: 01-15-1971    [address] [name] [place], [place] [address]    Home: [phone]    Provider: Prudy Feeler        * * *    Telephone Encounter    ---    Answered by  [name] Date: 09/23/2018       Time: 03:25 PM    Caller  Eber Jones from [facility](longterm hospital)    ------            Reason  [facility] from pt long term hospital is calling to speak with Dr. Delford Field. Eber Jones explains pt was cleared for surgery by neurosurgeon and now wants to to discuss surgery options. Best callback is [phone]            [name]                Action Taken                     [name],[name]  09/23/2018 3:27:32 PM >            STUBINSKI,ALISSA  09/24/2018 8:35:00 AM >                     * * *                ---          * * *         Patient: Emily Ford DOB: [birth year] Provider: Prudy Feeler 09/23/2018    ---    Note generated by eClinicalWorks EMR/PM Software ([email or web address])

## 2018-09-24 ENCOUNTER — Ambulatory Visit: Admitting: Urology

## 2018-09-24 NOTE — Progress Notes (Signed)
* * *      **  Emily Ford**    ------    58 Y old Female, DOB: November 03, 1970    [address] [name] [place], [place] [address]    Home: [phone]    Provider: [name]        * * *    Telephone Encounter    ---    Answered by  [name] Date: 09/24/2018       Time: 08:51 AM    Caller  Eber Jones from [facility](longterm hospital)    ------            Reason  Callback request            Message                     Eber Jones from pt long term hospital is calling to speak with [name]. Eber Jones explains pt was cleared for surgery by neurosurgeon and nephrology and now wants to to discuss surgery options. Best callback is [phone]            [name]                Action Taken                     [name],[name]  09/24/2018 8:52:33 AM >      .      Action - reviewed message. Please set up an appointment for this patient for preoperative discussion and coordination of care with Dr Dorothea Glassman and please tell patient to bring a letter from the Doctor that cleared her for surgery so we can call them in case we need to discuss things       ASK,MARIEPET  09/29/2018 10:23:44 AM >                     * * *                ---          * * *         Patient: Emily Ford DOB: 1971/06/23 Provider: [name] 09/24/2018    ---    Note generated by eClinicalWorks EMR/PM Software ([email or web address])

## 2018-10-05 ENCOUNTER — Ambulatory Visit: Admitting: Internal Medicine

## 2018-10-05 NOTE — Progress Notes (Signed)
* * *      **  Emily Ford**    ------    20 Y old Female, DOB: 19-Jun-1971    [address] [name] [place], [place] [address]    Home: [phone]    Provider: Prudy Feeler        * * *    Telephone Encounter    ---    Answered by  Felix Pacini Date: 10/05/2018       Time: 01:19 PM    Caller  pt    ------            Reason  Proceedure clearence            Message                     Hello,      the pt is calling in regards to her procedure with URO .She explained Dr.Luongo needs written letter clearing the pt to get her Nephrectomy procedure.Please fax over to the [facility] department.            Thank you                 Action Taken                     Washington,Danashia  10/05/2018 1:24:02 PM >            STUBINSKI,ALISSA  10/07/2018 8:37:24 AM > sent clearance.                    * * *                ---          * * *         Patient: Emily Ford DOB: 1970-09-18 Provider: Prudy Feeler 10/05/2018    ---    Note generated by eClinicalWorks EMR/PM Software ([email or web address])

## 2018-10-14 ENCOUNTER — Ambulatory Visit

## 2018-10-19 ENCOUNTER — Ambulatory Visit: Admit: 2018-10-19 | Payer: No Typology Code available for payment source

## 2018-10-19 ENCOUNTER — Ambulatory Visit

## 2018-10-19 ENCOUNTER — Ambulatory Visit: Admitting: Internal Medicine

## 2018-10-19 LAB — HX BF-CHEM/URINE
HX ALBUMIN RANDOM URINE: 35.8 mg/dL
HX ALBUMIN/CREATININE RATIO, URINE: 1071 mg/g — ABNORMAL HIGH (ref 0–30)
HX CREATININE, RANDOM URINE: 33.42 mg/dL
HX MICROALBUMIN CALC: 1.07 mg/mg
HX PROTEIN RANDOM, URINE (INCLUDES CREATININE): 2184 mg/g — ABNORMAL HIGH (ref 0–200)
HX PROTEIN, RANDOM URINE(INCLUDES CREATININE): 73 mg/dL — ABNORMAL HIGH (ref 0–15)

## 2018-10-19 LAB — HX DIABETES: HX ALBUMIN RANDOM URINE: 35.8 mg/dL

## 2018-10-19 NOTE — Progress Notes (Signed)
 * * *      [name]**    ------    7 Y old Female, DOB: 1970/12/18, External MRN: [id number]    Account Number: [id number]    [address], [place], [id number]    Home: [phone]    Insurance: [id number] [facility] [place]    PCP: Adah Salvage, MD Referring: Adah Salvage, MD    Appointment Facility: Nephrology        * * *    10/19/2018  **Appointment Provider:** Heloise Ochoa, PA **CHN#:** [id number]    ------     **Supervising Provider:** Prudy Feeler MD    ---       **Reason for Appointment**    ---      1\. Follow-up for CKD G2/A3    ---    2\. 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    .    Patient was initially referred from [name] for evaluation of renal  function given his tentative plan for nephrectomy. She is on chronic IV  antibiotics (meropenem) and has been staying at [place] as a result. She was  recently hospitalized at [facility] hospital due to burning around foley  catheter site. Foley was removed, but upon removal the balloon had appeared to  burst and there were fragements that broke off. They were able to remove a  piece, but it is unclear if there are any other retained pieces. She will be  following up with [name] on 10/22/2018 regarding this as well as future  nephrectomy.    .    Patient reports chronic lumbar pain due to degenerative disc disease and  regularly visits a pain clinic for symptom management. She also reports SOB,  which she has at baseline due to COPD and asthma and nausea due to  antibiotics. She otherwise  denies gross hematuria, urethral discharge, pyuria,  dysuria, fever, chills, chest pain, V/D, lower extremity swelling, or  dizziness/lightheadedness.     _PAST IMAGING_ :    Split Function Renal Scan (01/2018):    IMPRESSION: Normal renal cortical imaging of the left kidney, without evidence  of perfusion abnormality.    NM Renal Scan (03/2016)    Report: right kidney 22.3% uptake and left kidney with approximately 77.7%.     _Associated Providers_ :    PCP: Dr. Pricilla Holm    Urology: Dr. [name], prior urologist Dr. Richardean Canal.     _PAST RESULTS_ :    10/17/2018:    .    - eGFR >60, Cr 0.97, BUN 21    - Na 138, K 4.7, Cl 101, CO2 26, glucose 98    - albumin 3.9, Ca 8.9    - WBC 8.5, Hgb 13.2, Hct 38.5, Plt 233, MCV 92.6.      **Current Medications**    ---    Taking    * Acetaminophen 325 MG Capsule 1 capsule as needed Orally every 4 hrs    ---    * Alprazolam 0.5 MG Tablet 1 tablet Orally PRN TID    ---    *  Ascorbic Acid 500 MG Tablet Chewable 1 tablet Orally Once a day    ---    * Centrum - Tablet as directed Orally     ---    * Dulcolax 10 MG Suppository 1 suppository as needed Rectal Once a day    ---    * Dulera 200-5 MCG/ACT Aerosol 2 puffs Inhalation Twice a day    ---    * Fioricet 50-325-40 MG Tablet 1 tablet as needed Orally every 4 hrs    ---    * Gabapentin 400 mg Capsule 2 capsule Orally Twice a day    ---    * Meropenem 1 GM Solution Reconstituted as directed Intravenous every 8 hours    ---    * MiraLax - Packet 1 packet mixed with 8 ounces of fluid Orally Once a day    ---    * Narcan 4 MG/0.1ML Liquid Nasally     ---    * Niferex - Tablet as directed Orally     ---    * Paroxetine HCl 20 MG Tablet 1 tablet in the morning Orally Once a day    ---    * Pepcid 20 MG Tablet 1 tablet at bedtime as needed Orally Once a day    ---    * Percocet 5-325 MG Tablet 1 tablet as needed Orally every 8 hrs PRN    ---    * Polyethylene Glycol 3350 - Powder     ---    * Pulmicort 0.5 MG/2ML Suspension  2 ml Inhalation BID    ---    * Senna 8.6 MG Tablet 2 tablets at bedtime as needed Orally Once a day    ---    * Senokot 8.6 MG Tablet 2 tablets at bedtime as needed Orally Once a day    ---    * Simethicone-80 80 MG Tablet Chewable 1 tablet after meals and at bedtime as needed Orally Four times a day    ---    * Tamsulosin HCl 0.4 MG Capsule 1 capsule Orally Once a day    ---    * trazadone 50 mg 1 oral nightly for sleep    ---    * Tums 500 MG Tablet Chewable 1 tablet Orally Once a day    ---    * Tums (calcium carbonate) 200 mg PRN p.o. twice daily    ---    * Ventolin HFA     ---    * Xarelto 10 MG Tablet 1 tablet with food Orally Once a day    ---    * Zofran 4 MG Tablet as directed Orally Q6H PRN    ---    Not-Taking/PRN    * Bisac-Evac 10 MG Suppository 1 suppository as needed Rectal Once a day    ---    * CeFAZolin Sodium-NaCl 1-0.9 GM/10ML Solution Prefilled Syringe as directed Intravenous 2g Q8H    ---    * Coumadin     ---    * Ferrous Sulfate 325 (65 Fe) MG Tablet 1 tablet Orally Once a day    ---    * Lidocaine 5 % Patch 1 application to affected area as needed Externally Once a day    ---    * Omeprazole 20 MG Capsule Delayed Release 1 capsule Orally twice daily    ---    * Valium 5 MG Tablet 1 tablet as needed Orally Q6H    ---    *  Medication List reviewed and reconciled with the patient    ---      **Past Medical History**    ---      Anxiety.        ---    Depression.        ---    Arthritis.        ---    Back problem.        ---    Migraine.        ---    Eating disorder.        ---    Kidney stones.        ---    Hypokalemia.        ---    Otitis media.        ---    Acute pharyngitis.        ---    Disorder of kidney and ureter.        ---    Urinary tract infections (UTIs).        ---    Dysmenorrhea.        ---    Contact dermatitis.        ---    Shoulder joint pain.        ---    Neck pain.        ---    Backache.        ---    DSTYK.        ---    Parethesia.        ---    Edema.         ---    Dyspnea.        ---    Abdominal pain.        ---    Pyelonephritis.        ---    Right hydronephrosis.        ---    Staghorn calculus.        ---    Sepsis .        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        ---    Left MCA aneurysm.        ---    Right ophthalmic ICA aneurysm.        ---    Urinary rentention.        ---      **Surgical History**    ---      Appendectomy 12-15-84    ---    Casearean section December 16, 1987    ---    Ectopic pregnancy surgery 1999-12-16    ---      **Family History**    ---      Mother: deceased    ---    Father: alive, diagnosed with Unspecified heart disease    ---    2 brother(s) , 2 sister(s) . 2 son(s) , 4 daughter(s) .    ---    Mother died of liver scolerosis in December 16, 1987. Diabetes paternal grandmother.    ---      **Social History**    ---    Lives with: 6 kids and grandchildren.    Alcohol    _Denies_    Illicit drugs: Never.    Caffeine: 4 cups of coffee.    Tobacco    history: _Currently smoking Pack Year History: Started at 13 years until 1/2  pack per year_      **  Allergies**    ---      Toradol    ---      **Hospitalization/Major Diagnostic Procedure**    ---      Sepsis ( August-November-December- February) 2017-2018    ---    foley catheter removal/obtained piece 08/2018    ---      **Review of Systems**    ---     _Nephrology_ :    CONSTITUTIONAL: Denies unintentional weight change, fatigue, fever, chills. [name]  EYES: Denies recent visual changes . HENT: Denies headaches, auditory changes,  metallic taste, dysphagia. HEART: Denies chest pain, palpitations, edema.  GASTROINTESTINAL: Reports nausea and constipation. Denies vomiting, diarrhea,  abdominal pain, blood in stools. RESPIRATORY: Reports chronic SOB (at  baseline), denies cough. GENITOURINARY: Reports difficulty urinating. Has  chronic foley in place. Denies gross hematuria or pyuria. [name] MUSCULOSKELETAL:  Reports chronic lumbar pain, left sided sciatic pain with associated left leg  weakness. .  INTEGUMENTARY (SKIN AND/OR BREAST): Denies rashes, non healing  wounds, change in moles, any new or abnormal lesions. ENDOCRINE: Denies  increased thirst or urination, intolerance to heat or cold.  HEMATOLOGIC/LYMPHATIC: Denies easy bruising, swollen or tender glands.  PSYCHIATRIC: Reports anxiety.[name] NEUROLOGICAL SYSTEM: Denies dizziness,  numbness, tingling, change in mental status.         **Vital Signs**    ---    Pain scale 5, Ht-in 61.50, Wt-lbs 182, BMI 33.83, BP 110/60, HR 80, BSA 1.89,  Ht-cm 156.21, Wt-kg 82.55, Wt Change 24 lb.      **Physical Examination**    ---     _NEPHROLOGY_ :    CONSTITUTIONAL / VITALS: Appears well and in no apparent distress.    HEENT: Eyes with no pallor or icterus. Oropharynx clear and oral mucous  membrane moist.    CVS: JVD not elevated. S1 and S2 normal, tachycardic and rhythm. No murmur,  rub or gallop..    LUNGS: Clear to auscultation bilaterally. No wheezes, rales, or rhonchi. .    ABDOMEN: Obese. Wearing abdominal binder. Right flank with 2 tubes:  nephrostomy and abscess drainage. Abdomen distended, non-tender no CVA  tenderness. Foley catheter in place.[name]    EXTREMITIES:  1+ lower extremity edema (L>R)..    SKIN: No pallor, icterus, bruise or rash.    NEURO: Alert and oriented to time, place and person. No asterixis.    MSK: No joint swelling.         **Assessments**    ---    1\. 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 Most recent labs show stable  kidney function with eGFR >60, creatinine 0.97. UACR ~1g. 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 Based on current regimen of chronic antibiotics, and  nonfunctioning right kidney it is our recommendation that she move forward  with nephrectomy.    ---      **Treatment**    ---      **1\. LABS**    _LAB: Albumin (ALB)_    _LAB: Magnesium (MG)_    _LAB: Calcium (Ca)_  _LAB: Glucose (GLU)_    _LAB: Phosphorus (PHOS)_    _LAB: Blood Urea Nitrogen (BUN)_    _LAB: Albumin, random urine w/ creat (UALB)_  Albumin Random, Urine 35.8   - mg/dL    ------------    Microalbumin Calc, Urine 1.07   \- mg/mg    ------------    Creatinine Random, Urine 33.42   \- mg/dL    ------------    Albumin/Creatinine Ratio, Urine 1071 H 0 - 30 - mg/g    ------------    _LAB: Protein/Creatinine, Random Urine (UPROT)_ Protein, Random Urine  (includes Creatinine) 73 H 0 - 15 - mg/dL    ------------    Creatinine Random, Urine 33.42   \- mg/dL    ------------    Protein/Creatinine Ratio, Urine 2184 H 0 - 200 - mg/g    ------------    _LAB: CBC/DIFF with PLT (CBCWD)_    _LAB: Electrolytes (Na, K, Cl, CO2) LYTES_    _LAB: Creatinine (CR)_    _LAB: KBPC Urinalysis_ SG 1.015     ------------    pH 6     ------------    LEU +2     ------------    NIT +1     ------------    PRO +2     ------------    GLU norm     ------------    KET neg     ------------    UBG norm     ------------    BIL neg     ------------    BLD +2     ------------    _LAB: GFR, NAA_    _LAB: Vitamin D, 25 hydroxy (VITD)_         **2\. Xanthogranulomatous pyelonephritis**    Clinical Notes: .    - current eGFR is representative of left kidney function. 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    - most recent eGFR is >16ml/min/1.73m2 with creatinine of 0.97mg /dL    - UACR ~1O, not on ACEi due to hypotension. 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    - BP at goal of <130/80. Based on prior  hospital visits, and BP taken at  home, BP runs on the lower side: 100-115/60-70s.    - will trend proteinuria and BP.    - at this point, chronic infection and need for nephrectomy supersedes  proteinuria.    - has appointment with urology on 2/27    - Will continue to monitor.        **3\. Others**    Notes: **ATTENDING ATTESTATION** : I personally interviewed and examined the  patient and reviewed PA Talesha Ellithorpe's note. I agree with the history,  examination, assessment and plan as she sets them forth.     **ATTENDING NOTE: ** 48 year old woman whom we follow for CKD in the setting  of xanthogranulomatous pyelonephritis with perinephric abscesses status post  nephrostomy and pigtail placement.  There also may have been some question of  retained Foley catheter balloon fragments due to balloon rupture?  Her history  is somewhat tangential. In this setting she has CKD 2. Nuclear medicine scan  showed that the right kidney was not visualized, suggesting it is not  contributing to her renal function. As otherwise she would remain dependent on  long-term antibiotics, incur long-term hazards of a tunneled line, and the  risk of repeat infections with escalating antibiotic resistance, and  particularly because the infected kidney appears to  be nonfunctioning, we felt  she should have a nephrectomy. She continues to have issues with PICC, on  meropenem q8h. BP: 110/60, GFR preserved. No change in plan, she is undergoing  work-up for nephrectomy as I understand it. Further comments per PA Sabra Sessler,  above. Lab addendum: She is considerable albuminuria but it is impossible to  tell if this is leaking from her damaged kidney, from her uninfected kidney,  or both; even if the uninfected kidney has proteinuria, I would presume  chronic infection would be a contributor to systemic inflammation and would  not change the recommendations above..     **Procedure Codes**    ---      27253 URINALYSIS TEST PROCEDURE    ---       **Follow Up**    ---    6 Months    **Appointment Provider:** Heloise Ochoa, PA    Electronically signed by Prudy Feeler on 10/22/2018 at 04:37 PM EST    Sign off status: Completed        * * *        Nephrology    [address]    [address] 4[address] floor    [place], [place] [address]    Tel: [phone]    Fax: [phone]              * * *         Patient: Emily, Ford DOB: Sep 03, 1970 Progress Note: Heloise Ochoa,  PA 10/19/2018    ---    Note generated by eClinicalWorks EMR/PM Software ([email or web address])

## 2018-10-19 NOTE — Progress Notes (Signed)
 .  Progress Notes  .  Patient: Emily Ford  Provider: Heloise Ochoa    .  DOB: 02/14/1971 Age: 48 Y Sex: Female  Supervising Provider:: Prudy Feeler MD  Date: 10/19/2018  .  PCP: Adah Salvage MD  Date: 10/19/2018  .  --------------------------------------------------------------------------------  .  REASON FOR APPOINTMENT  .  1. Follow-up for CKD G2/A3  .  2. Xanthogranulomatous pyelonephritis  .  HISTORY OF PRESENT ILLNESS  .  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 .Patient was initially  referred from [name] for evaluation of renal function given  his tentative plan for nephrectomy. She is on chronic IV  antibiotics (meropenem) and has been staying at [place] as a  result. She was recently hospitalized at [facility] due  to burning around foley catheter site. Foley was removed, but  upon removal the balloon had appeared to burst and there were  fragements that broke off. They were able to remove a piece, but  it is unclear if there are any other retained pieces. She will be  following up with [name] on 10/22/2018 regarding this as well  as future nephrectomy. .Patient reports chronic lumbar pain due  to degenerative disc disease and regularly visits a pain clinic  for symptom management. She also reports SOB, which she has at  baseline due to COPD and asthma and nausea due to antibiotics.  She otherwise denies gross hematuria, urethral discharge, pyuria,  dysuria, fever, chills, chest pain, V/D, lower extremity  swelling, or dizziness/lightheadedness.  [name]  PAST IMAGING:   Split Function Renal Scan (01/2018):IMPRESSION: Normal renal  cortical imaging of the  left kidney, without evidence of  perfusion abnormality.NM Renal Scan (03/2016)Report: right kidney  22.3% uptake and left kidney with approximately 77.7%.  .  Associated Providers:   PCP: Dr. Payton Spark KapogiannisforUrology: Dr. [name], prior  urologist Dr. Richardean Canal.  [name]  PAST RESULTS:   10/17/2018:.- eGFR >60, Cr 0.97, BUN 21- Na 138, K 4.7, Cl 101,  CO2 26, glucose 98- albumin 3.9, Ca 8.9- WBC 8.5, Hgb 13.2, Hct  38.5, Plt 233, MCV 92.6.  [name]  CURRENT MEDICATIONS  .  Taking Acetaminophen 325 MG Capsule 1 capsule as needed Orally  every 4 hrs  Taking Alprazolam 0.5 MG Tablet 1 tablet Orally PRN TID  Taking Ascorbic Acid 500 MG Tablet Chewable 1 tablet Orally Once  a day  Taking Centrum - Tablet as directed Orally  Taking Dulcolax 10 MG Suppository 1 suppository as needed Rectal  Once a day  Taking Dulera 200-5 MCG/ACT Aerosol 2 puffs Inhalation Twice a  day  Taking Fioricet 50-325-40 MG Tablet 1 tablet as needed Orally  every 4 hrs  Taking Gabapentin 400 mg Capsule 2 capsule Orally Twice a day  Taking Meropenem 1 GM Solution Reconstituted as directed  Intravenous every 8 hours  Taking MiraLax - Packet 1 packet mixed with 8 ounces of fluid  Orally Once a day  Taking Narcan 4 MG/0.1ML Liquid Nasally  Taking Niferex - Tablet as directed Orally  Taking Paroxetine HCl 20 MG Tablet 1 tablet in the morning Orally  Once a day  Taking Pepcid 20 MG Tablet 1 tablet at bedtime as needed Orally  Once  a day  Taking Percocet 5-325 MG Tablet 1 tablet as needed Orally every 8  hrs PRN  Taking Polyethylene Glycol 3350 - Powder  Taking Pulmicort 0.5 MG/2ML Suspension 2 ml Inhalation BID  Taking Senna 8.6 MG Tablet 2 tablets at bedtime as needed Orally  Once a day  Taking Senokot 8.6 MG Tablet 2 tablets at bedtime as needed  Orally Once a day  Taking Simethicone-80 80 MG Tablet Chewable 1 tablet after meals  and at bedtime as needed Orally Four times a day  Taking Tamsulosin HCl 0.4 MG Capsule 1 capsule Orally Once a day  Taking  trazadone 50 mg 1 oral nightly for sleep  Taking Tums 500 MG Tablet Chewable 1 tablet Orally Once a day  Taking Tums (calcium carbonate) 200 mg PRN p.o. twice daily  Taking Ventolin HFA  Taking Xarelto 10 MG Tablet 1 tablet with food Orally Once a day  Taking Zofran 4 MG Tablet as directed Orally Q6H PRN  Not-Taking/PRN Bisac-Evac 10 MG Suppository 1 suppository as  needed Rectal Once a day  Not-Taking/PRN CeFAZolin Sodium-NaCl 1-0.9 GM/10ML Solution  Prefilled Syringe as directed Intravenous 2g Q8H  Not-Taking/PRN Coumadin  Not-Taking/PRN Ferrous Sulfate 325 (65 Fe) MG Tablet 1 tablet  Orally Once a day  Not-Taking/PRN Lidocaine 5 % Patch 1 application to affected area  as needed Externally Once a day  Not-Taking/PRN Omeprazole 20 MG Capsule Delayed Release 1 capsule  Orally twice daily  Not-Taking/PRN Valium 5 MG Tablet 1 tablet as needed Orally Q6H  Medication List reviewed and reconciled with the patient  .  PAST MEDICAL HISTORY  .  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  ALLERGIES  .  Toradol  .  SURGICAL HISTORY  .  Appendectomy 1986  Casearean section 1989  Ectopic pregnancy surgery 2001  .  FAMILY HISTORY  .  Mother: deceased  Father: alive, diagnosed with Unspecified heart disease  2 brother(s) , 2 sister(s) . 2 son(s) , 4 daughter(s) .  Mother died of liver scolerosis in 93. Diabetes paternal  grandmother.  .  SOCIAL HISTORY  .  [name]  Lives with: 6 kids and grandchildren.  .  .  Alcohol  Denies  .  [name]  Illicit drugs: Never.  .  .  Caffeine: 4 cups of  coffee.  .  .  Tobacco  history:Currently smoking Pack Year History: Started at 13 years  until 1/2 pack per year  .  HOSPITALIZATION/MAJOR DIAGNOSTIC PROCEDURE  .  Sepsis ( August-November-December- February) 2017-2018  foley catheter removal/obtained piece 08/2018  .  REVIEW OF SYSTEMS  .  Nephrology:  .  CONSTITUTIONAL:    Denies unintentional weight change, fatigue,  fever, chills.  [name] EYES:    Denies recent visual changes  . HENT:     Denies headaches, auditory changes, metallic taste, dysphagia .  HEART:    Denies chest pain, palpitations, edema .  GASTROINTESTINAL:    Reports nausea and constipation. Denies  vomiting, diarrhea, abdominal pain, blood  in stools .  RESPIRATORY:    Reports chronic SOB (at baseline), denies cough .  GENITOURINARY:    Reports difficulty urinating. Has chronic foley  in place. Denies gross hematuria or pyuria.  [name] MUSCULOSKELETAL:     Reports chronic lumbar pain, left sided sciatic pain with  associated left leg weakness.  . INTEGUMENTARY (SKIN AND/OR  BREAST):    Denies rashes, non healing wounds, change in moles,  any new or abnormal lesions . ENDOCRINE:    Denies increased  thirst or urination, intolerance to heat or cold .  HEMATOLOGIC/LYMPHATIC:    Denies easy bruising, swollen or tender  glands . PSYCHIATRIC:    Reports anxiety. [name] NEUROLOGICAL SYSTEM:     Denies dizziness, numbness, tingling, change in mental status .  [name]  VITAL SIGNS  .  Pain scale 5, Ht-in 61.50, Wt-lbs 182, BMI 33.83, BP 110/60, HR  80, BSA 1.89, Ht-cm 156.21, Wt-kg 82.55, Wt Change 24 lb.  .  PHYSICAL EXAMINATION  .  NEPHROLOGY:  CONSTITUTIONAL / VITALS:  Appears well and in no apparent  distress.  HEENT:  Eyes with no pallor or icterus. Oropharynx clear and oral  mucous membrane moist.  CVS:  JVD not elevated. S1 and S2 normal, tachycardic and rhythm.  No murmur, rub or gallop..  LUNGS:  Clear to auscultation bilaterally. No wheezes, rales, or  rhonchi. .  ABDOMEN:  Obese. Wearing abdominal binder. Right  flank with 2  tubes: nephrostomy and abscess drainage. Abdomen distended,  non-tender no CVA tenderness. Foley catheter in place.[name]  EXTREMITIES:  1+ lower extremity edema (L>R)..  SKIN:  No pallor, icterus, bruise or rash.  NEURO:  Alert and oriented to time, place and person. No  asterixis.  MSK:  No joint swelling.  .  ASSESSMENTS  .  LABS - See Below (Primary)  .  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  Marland KitchenIn summary, Emily Ford is a 48 year old female, with G2/A3 CKD  with a complex medical history including anxiety, depression,  previous eating disorder, recurrent UTIs 2/2 staghorn  calculus/proteus complicated by xanthogranulomatous  pyelonephritis with perinephric abscesses s/o nephrostomy and  pigtail placement on chronic antibiotics. Most recent labs show  stable kidney function with eGFR >60, creatinine 0.97. UACR   1g.  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 Based on current regimen of  chronic antibiotics, and nonfunctioning right kidney it is our  recommendation that she move forward with nephrectomy.  .  TREATMENT  .  LABS  LAB: Albumin (ALB)  .  LAB: Magnesium (MG)  .  LAB: Calcium (Ca)  .  LAB: Glucose (GLU)  .  LAB: Phosphorus (PHOS)  .  LAB: Blood Urea Nitrogen (BUN)  .  LAB: Albumin, random urine w/ creat (UALB)  Albumin Random, Urine     35.8     ( - mg/dL)  Microalbumin Calc, Urine     1.07     ( - mg/mg)  Creatinine Random, Urine     33.42     ( - mg/dL)  Albumin/Creatinine Ratio, Urine     1071     (0 - 30 - mg/g)  .  [name]  LAB: Protein/Creatinine, Random Urine (UPROT)  Protein, Random Urine (includes Creatinine)     73     (0 - 15 -  mg/dL)  Creatinine Random, Urine     33.42     ( - mg/dL)  Protein/Creatinine Ratio, Urine  2184     (0 - 200 - mg/g)  .  [name]  LAB: CBC/DIFF with PLT (CBCWD)  .  LAB: Electrolytes (Na, K, Cl, CO2) LYTES  .  LAB: Creatinine (CR)  .  LAB: KBPC Urinalysis  SG     1.015     ()  pH     6     ()  LEU     +2      ()  NIT     +1     ()  PRO     +2     ()  GLU     norm     ()  KET     neg     ()  UBG     norm     ()  BIL     neg     ()  BLD     +2     ()  .  [name]  LAB: GFR, NAA  .  LAB: Vitamin D, 25 hydroxy (VITD)  .  [name]  Xanthogranulomatous pyelonephritis  Clinical Notes: [name]- current eGFR is representative of left kidney  function. 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 - most recent eGFR is >67ml/min/1.73m2 with  creatinine of 0.97mg /dL- UACR   1g, not on ACEi due to  hypotension. 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 Based on prior hospital  visits, and BP taken at home, BP runs on the lower side:  100-115/60-70s. - will trend proteinuria and BP.- at this point,  chronic infection and need for nephrectomy supersedes  proteinuria. - has appointment with urology on 2/27- Will  continue to monitor.  .  .  Others  Notes: ATTENDING ATTESTATION: I personally interviewed and  examined the patient and reviewed PA Stubinski's note. I agree  with the history, examination, assessment and plan as she sets  them forth.ATTENDING NOTE: 48 year old woman whom we follow for  CKD in the setting of xanthogranulomatous pyelonephritis with  perinephric abscesses status post nephrostomy and pigtail  placement. There also may have been some question of retained  Foley catheter balloon fragments due to balloon rupture? Her  history is somewhat tangential. In this setting she has CKD 2.  Nuclear medicine scan showed that the right kidney was not  visualized, suggesting it is not contributing to her renal  function. As otherwise she would remain dependent on long-term  antibiotics, incur long-term hazards of a tunneled line, and the  risk of repeat infections with escalating antibiotic resistance,  and particularly because the infected kidney appears to be  nonfunctioning, we felt she should have a nephrectomy. She  continues to have issues with PICC, on meropenem q8h. BP:  110/60,  GFR preserved. No change in plan, she is undergoing work-up for  nephrectomy as I understand it. Further comments per PA  Stubinski, above. Lab addendum: She is considerable albuminuria  but it is impossible to tell if this is leaking from her damaged  kidney, from her uninfected kidney, or both; even if the  uninfected kidney has proteinuria, I would presume chronic  infection would be a contributor to systemic inflammation and  would not change the recommendations above..  .  PROCEDURE CODES  .  [id number] URINALYSIS TEST PROCEDURE  .  FOLLOW UP  .  6 Months  .  [name]  Appointment Provider: Heloise Ochoa, PA  .  Electronically signed by Pennie Rushing  WRIGHT on 10/22/2018  at 04:37 PM EST  .  CONFIRMATORY SIGN OFF  .  [name]  Document electronically signed by Heloise Ochoa    .

## 2018-10-22 ENCOUNTER — Ambulatory Visit: Admit: 2018-10-22 | Payer: No Typology Code available for payment source

## 2018-10-22 ENCOUNTER — Ambulatory Visit: Admitting: Urology

## 2018-10-22 NOTE — Progress Notes (Signed)
 .  Progress Notes  .  Patient: Emily Ford  Provider: [name]    .  DOB: [birth year] Age: 48 Y Sex: Female  .  PCP: Adah Salvage MD  Date: 10/22/2018  .  --------------------------------------------------------------------------------  .  REASON FOR APPOINTMENT  .  1. Right xanthogranulomatous pyelonephritis  .  2. Urinary retention  .  3. Staghorn calculus  .  4. Severe hydronephrosis  .  5. Inguinal pain  .  HISTORY OF PRESENT ILLNESS  .  Adult Urology:   This is a 48 year old female with a complex medical history  including, but not limited to anxiety, depression, arthritis,  back problem, migraine, eating disorder, kidney stones,  hypokalemia, otitis media, acute pharyngitis, disorder of kidney  and ureter, UTIs, dysmenorrhea, contact dermatitis, shoulder  joint pain, neck pain, backache, DSTYK, paresthesia, edema,  dyspnea, abdominal pain, right hydronephrosis, staghorn calculus,  Inguinal pain and right xanthogranulomatous pyelonephritis, who  is here today for a follow-up visit..Of note, on 08/17/2018, the  patient called the [facility] reporting that she was discharged  from her facility with an indwelling Foley and had some concerns.  She called again on 09/24/2018 to report that she was cleared for  surgery by neurosurgeon and nephrology and now wants to discuss  surgery options..Her staghorn calculus was complicated by  xanthogranulomatous pyelonephritis with perinephric abscesses s/p  nephrostomy and pigtail placement. She was recently admitted at  [facility]. Leane Call (06/06/18) for about a week for replacement of abscess  drainage tube. She was started on long term abx for Cefazolin IV.  She previously was able to get infusions at home, but now has to  either go to [place] or [facility]. During her [place]. Leane Call  (06/06/18) for about a week for replacement of abscess drainage  tube. While admitted, she was found to have >1 L of urinary  retention and has been unable to fully empty her  bladder since  then. 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 revealed there is relatively decreased right renal  uptake. Right kidney takes up approximately 22.3 % of the  administered radiopharmaceutical and left kidney takes up  approximately 77.7 %. Evidence of right renal obstruction..Ms.  Kosta also has a history of a staghorn calculus. .Renal-Spect in  01/2018 reviewed by [name] revealed normal renal cortical  imaging of the left kidney, without evidence of perfusion  abnormality.She has a history of UTI/urosepsis but last episode  in April 2018, since then she has been on home IV antibiotics  since July 108. He has been sepsis since that time. .BUN, Cr,  GFR:07/2016: 20, 1.07, 68.08/2016: 12, 1.10, 66.10/2017: 19,  1.13, 63.06/2018: 21, 1.01, 65..PSA:11/2016: 0.69.Marland KitchenUrine  Culture:07/2016: No Growth.08/26/2016: <=10,000 CFU/ml Mixed  bacterial flora.09/16/2016: 50,000-99,000 CFU/ml Group B  Streptococcus09/2018: >=100,000 CFU/ml Group B  Streptococcus04/2018: No growth..She voids with good stream and  good control..She has no other complaints. Patient denies gross  hematuria, urethral discharge, pyuria, dysuria, UTI, fever,  chills, night sweats, weight loss and skeletal pain..  .  Risk Assessment:  Is Patient a Fall Risk?  .  :Yes, fall risk procedures implemented per hospital policy  .  [name]  CURRENT MEDICATIONS  .  Taking Acetaminophen 325 MG  Capsule 1 capsule as needed Orally  every 4 hrs  Taking Alprazolam 0.5 MG Tablet 1 tablet Orally PRN TID  Taking Ascorbic Acid 500 MG Tablet Chewable 1 tablet Orally Once  a day  Taking Centrum - Tablet as directed Orally  Taking Dulcolax 10 MG Suppository 1 suppository as needed Rectal  Once a  day  Taking Dulera 200-5 MCG/ACT Aerosol 2 puffs Inhalation Twice a  day  Taking Fioricet 50-325-40 MG Tablet 1 tablet as needed Orally  every 4 hrs  Taking Gabapentin 400 mg Capsule 2 capsule Orally Twice a day  Taking Gas-X 80 MG Tablet Chewable 1 tablet after meals and at  bedtime as needed Orally Four times a day  Taking Meropenem 1 GM Solution Reconstituted as directed  Intravenous every 8 hours  Taking Merrem 1 GM Solution Reconstituted as directed Intravenous  Taking MiraLax - Packet 1 packet mixed with 8 ounces of fluid  Orally Once a day  Taking Narcan 4 MG/0.1ML Liquid Nasally  Taking Neurontin 300 MG Capsule 1 capsule Orally Once a day  Taking Niferex - Tablet as directed Orally  Taking Paroxetine HCl 20 MG Tablet 1 tablet in the morning Orally  Once a day  Taking Paxil 20 MG Tablet 1 tablet in the morning Orally Once a  day  Taking Pepcid 20 MG Tablet 1 tablet at bedtime as needed Orally  Once a day  Taking Percocet 5-325 MG Tablet 1 tablet as needed Orally every 8  hrs PRN  Taking Polyethylene Glycol 3350 - Powder  Taking Proventil  Taking Pulmicort 0.5 MG/2ML Suspension 2 ml Inhalation BID  Taking Rivaroxaban 10 MG Tablet 1 tablet with food Orally Once a  day  Taking Saline Flush 0.9 % Solution as directed Intravenous  Taking Senna 8.6 MG Tablet 2 tablets at bedtime as needed Orally  Once a day  Taking Senokot 8.6 MG Tablet 2 tablets at bedtime as needed  Orally Once a day  Taking Simethicone-80 80 MG Tablet Chewable 1 tablet after meals  and at bedtime as needed Orally Four times a day  Taking Tamsulosin HCl 0.4 MG Capsule 1 capsule Orally Once a day  Taking trazadone 50 mg 1 oral nightly for sleep  Taking Tums 500 MG Tablet Chewable 1 tablet Orally Once a day  Taking Tums (calcium carbonate) 200 mg PRN p.o. twice daily  Taking Ventolin HFA  Taking Xanax 0.5 MG Tablet 1 tablet Orally Twice a day  Taking Xarelto 10 MG Tablet 1 tablet with food Orally Once a day  Taking Zofran 4 MG Tablet as directed  Orally Q6H PRN  Not-Taking/PRN Bisac-Evac 10 MG Suppository 1 suppository as  needed Rectal Once a day  Not-Taking/PRN CeFAZolin Sodium-NaCl 1-0.9 GM/10ML Solution  Prefilled Syringe as directed Intravenous 2g Q8H  Not-Taking/PRN Coumadin  Not-Taking/PRN Ferrous Sulfate 325 (65 Fe) MG Tablet 1 tablet  Orally Once a day  Not-Taking/PRN Lidocaine 5 % Patch 1 application to affected area  as needed Externally Once a day  Not-Taking/PRN Omeprazole 20 MG Capsule Delayed Release 1 capsule  Orally twice daily  Not-Taking/PRN Valium 5 MG Tablet 1 tablet as needed Orally Q6H  Medication List reviewed and reconciled with the patient  .  PAST MEDICAL HISTORY  .  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  ALLERGIES  .  Toradol  .  SURGICAL HISTORY  .  Appendectomy 1986  Casearean section 1989  Ectopic pregnancy surgery 2001  .  FAMILY HISTORY  .  Mother: deceased  Father: alive, diagnosed with Unspecified heart disease  2 brother(s) , 2 sister(s) . 2 son(s) , 4 daughter(s) .  Mother died of liver scolerosis in 71. Diabetes paternal  grandmother.  .  SOCIAL HISTORY  .  [name]  Lives with: 6 kids and grandchildren.  .  Alcohol Denies.  .  Illicit drugs: Never.  .  Caffeine: 4 cups of coffee.  .  Tobaccohistory:Currently smoking Pack Year History: Started at 13  years until 1/2 pack per year .  [name]  HOSPITALIZATION/MAJOR DIAGNOSTIC PROCEDURE  .  Sepsis ( August-November-December- February) 2017-2018  foley catheter removal/obtained piece 08/2018  .  REVIEW OF SYSTEMS  .  Urology  ROS:  .  Constitutional:    No fevers/chills/weight loss or general  weakness . Eyes:    No decreased visual acuity, loss of vision or  diplopia . Integumentary:    No rash or skin lesions, No changes  in hair or nail character . Lungs:    no shortness of breath, new  frequent cough, no history of asthma . Cardiovascular:    No  chest pain, palpitations . Gastrointestinal:    No abdominal  pain/nausea/vomiting/bowel changes . Genitourinary:    chronic  foley d/t retention. Has a nephorsotomy tube and abcess drainage  tube.  . Musculoskeletal:    No joint\muscle pain, decreased  mobility or joint swelling . Neurological:    No headache,  dizziness, seizures, light headedness, memory loss or numbness .  Psychiatry:    No mood/behavioral changes, anxiety or depression  . Endocrine:    No hirsutism or excessive hair loss, polyuria,  polydipsia or alopecia . Hematologic/Lymphatic:    No  lymphadenopathy, easy bruising or abnormal bleeding .  Allergic/Immunologic:    No environmental or food allergies .  [name]  VITAL SIGNS  .  Pain scale 0, Ht-in 61.50, Wt-lbs 182, BMI 33.83, BP 132/65, HR  107, BSA 1.89, Ht-cm 156.21, Wt-kg 82.55.  [name]  PHYSICAL EXAMINATION  .  Urology PE:  General Appearance:  well-developed, well nourished, NAD, normal  secondary sexual characteristics. HEENT:  normocephalic,  atraumatic, PERRLA, EOMI, anicteric, no nasal discharge, sinuses  non-tender, oral pharynx within normal limits. Skin:  no bruises,  no petechiae, no rashes or lesions. Neck:  supple, no masses,  trachea in midline, normal range of motion. Chest  normal AP  diameter, no rib tenderness. Lungs:  normal respirations,  symmetric excursion with no accesory muscle use. Back:  no CVAT,  vertebral column aligned, no sacral Junction or dimples, no  scoliosis/kyphosis, masses or tenderness. Cardiovascular:  RRR,  no murmur, pulses intact. Abdomen:  Has a right nephorsotomy tube  and abcess drainage tube in place. Abdomen globular obese,  soft,  benign, non-tender, non-distended, no palpable masses, no  hepato-splenomegaly, no hernias, bladder not palpable.  Extremities:  no clubbing, cyanosis or pitting edema.  Musculo-Skeletal:  no muscle wasting, joint swelling or  tenderness. Lymphatic:  no cervical, axillary or inguinal  adenopathy. Neurologic:  oriented x3,  no focal deficits.  GU Female:  Genitalia:  Normally developed female genitalia. Pelvic exam:   Deferred as per patient request.  .  ASSESSMENTS  .  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 Her staghorn calculus/proteus  c/b xanthogranulomatous pyelonephritis with perinephric abscesses  s/p nephrostomy and pigtail placement  .  Hydronephrosis, right - N13.30, Secondary to Staghorn calculus  .  Inguinal pain, unspecified laterality - R10.30  .  TREATMENT  .  Emphysematous pyelonephritis of right kidney  LAB: Urine Dip POC  .  Notes: Open right nephrectomy.Long discussion regarding plan of  management, do agree he requires nephrectomy to manage recurring  UTIs and abscess. She wants to proceed. She has clearance from  neuro with regards to cerebral aneurysm .  .  .  Staghorn calculus  Notes: As above.  .  .  Hydronephrosis, right  Notes: As above.  .  .  Others  Notes: RTC for preop H & P..Due to the complexity of her  condition Dr Dorothea Glassman spent 50 minutes with patient reviewing her  medical history and for management, treatment, counselling, and  coordination of care for her condition. 50% of this time was  counselling the patient. .  .  PREVENTIVE MEDICINE  .  Counseling:  Smoking   . BMI Management   .  [name]  FOLLOW UP  .  RTC for preop H & P.  .  Electronically signed by [name] , MD on  10/30/2018 at 08:34 AM EST  .  Document electronically signed by [name]    .

## 2018-10-22 NOTE — Progress Notes (Signed)
 * * *      Emily Ford**    ------    31 Y old Female, DOB: 1971/06/20, External MRN: [id number]    Account Number: [id number]    [address], [place], [place]    Home: [phone]    Insurance: [id number] [employer] [facility] [place]    PCP: Adah Salvage, MD Referring: Adah Salvage, MD    Appointment Facility: [facility]        * * *    10/22/2018 Progress Notes: [name], MD **CHN#:** [id number]    ------    ---       **Reason for Appointment**    ---      1\. Right xanthogranulomatous pyelonephritis    ---    2\. Urinary retention    ---    3\. Staghorn calculus    ---    4\. Severe hydronephrosis    ---    5\. Inguinal pain    ---      **History of Present Illness**    ---     _Adult Urology_ :    This is a 48 year old female with a complex medical history including, but not  limited to anxiety, depression, arthritis, back problem, migraine, eating  disorder, kidney stones, hypokalemia, otitis media, acute pharyngitis,  disorder of kidney and ureter, UTIs, dysmenorrhea, contact dermatitis,  shoulder joint pain, neck pain, backache, DSTYK, paresthesia, edema, dyspnea,  abdominal pain, right hydronephrosis, staghorn calculus, Inguinal pain and  right xanthogranulomatous pyelonephritis, who is here today for a follow-up  visit.    .    Of note, on 08/17/2018, the patient called the [facility] reporting that she  was discharged from her facility with an indwelling Foley and had some  concerns. She called again on 09/24/2018 to report that she was cleared for  surgery by neurosurgeon and nephrology and now wants to discuss surgery  options.    .    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 She was  recently admitted at [facility]. Leane Call (06/06/18) for about a week for replacement of  abscess drainage tube. She was started on long term abx for Cefazolin IV. She  previously was  able to get infusions at home, but now has to either go to  [place] or [facility]. During her [facility]. Leane Call (06/06/18) for about a week  for replacement of abscess drainage tube. While admitted, she was found to  have >1 L of urinary retention and has been unable to fully empty her bladder  since then. She currently has a chronic Foley Catheter and was referred to the  [facility] for further evaluation.    .    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    She also has a history of right hydronephrosis.    [name]    [facility] Kidney in 03/2016 (Report Only) reviewed by [name] revealed there is  relatively decreased right renal uptake. Right kidney takes up approximately  22.3 % of the administered radiopharmaceutical and left kidney takes up  approximately 77.7 %. Evidence of right renal obstruction.    .    Ms. Rosch also has a history of a staghorn calculus.    .    Renal-Spect in 01/2018 reviewed by [name] revealed normal renal cortical  imaging of the left  kidney, without evidence of perfusion abnormality.    She has a history of UTI/urosepsis but last episode in April 2018, since then  she has been on home IV antibiotics since July 108. He has been sepsis since  that time.    .    BUN, Cr, GFR:    07/2016: 20, 1.07, 68.    08/2016: 12, 1.10, 66.    10/2017: 19, 1.13, 63.    06/2018: 21, 1.01, 65.    [name]    PSA:    11/2016: 0.69.    [name]    Urine Culture:    07/2016: No Growth.    08/26/2016: <=10,000 CFU/ml Mixed bacterial flora.    09/16/2016: 50,000-99,000 CFU/ml Group B Streptococcus    04/2017: >=100,000 CFU/ml Group B Streptococcus    11/2016: No growth.    .    She voids with good stream and good control.    .    She has no other complaints. Patient denies gross hematuria, urethral  discharge, pyuria, dysuria, UTI, fever, chills, night sweats, weight loss and  skeletal pain.    [name] Assessment_ :    Is Patient a Fall Risk? : Yes, fall  risk procedures implemented per hospital  policy .     **Current Medications**    ---    Taking    * Acetaminophen 325 MG Capsule 1 capsule as needed Orally every 4 hrs    ---    * Alprazolam 0.5 MG Tablet 1 tablet Orally PRN TID    ---    * Ascorbic Acid 500 MG Tablet Chewable 1 tablet Orally Once a day    ---    * Centrum - Tablet as directed Orally     ---    * Dulcolax 10 MG Suppository 1 suppository as needed Rectal Once a day    ---    * Dulera 200-5 MCG/ACT Aerosol 2 puffs Inhalation Twice a day    ---    * Fioricet 50-325-40 MG Tablet 1 tablet as needed Orally every 4 hrs    ---    * Gabapentin 400 mg Capsule 2 capsule Orally Twice a day    ---    * Gas-X 80 MG Tablet Chewable 1 tablet after meals and at bedtime as needed Orally Four times a day    ---    * Meropenem 1 GM Solution Reconstituted as directed Intravenous every 8 hours    ---    * Merrem 1 GM Solution Reconstituted as directed Intravenous     ---    * MiraLax - Packet 1 packet mixed with 8 ounces of fluid Orally Once a day    ---    * Narcan 4 MG/0.1ML Liquid Nasally     ---    * Neurontin 300 MG Capsule 1 capsule Orally Once a day    ---    * Niferex - Tablet as directed Orally     ---    * Paroxetine HCl 20 MG Tablet 1 tablet in the morning Orally Once a day    ---    * Paxil 20 MG Tablet 1 tablet in the morning Orally Once a day    ---    * Pepcid 20 MG Tablet 1 tablet at bedtime as needed Orally Once a day    ---    * Percocet 5-325 MG Tablet 1 tablet as needed Orally every 8 hrs PRN    ---    *  Polyethylene Glycol 3350 - Powder     ---    * Proventil     ---    * Pulmicort 0.5 MG/2ML Suspension 2 ml Inhalation BID    ---    * Rivaroxaban 10 MG Tablet 1 tablet with food Orally Once a day    ---    * Saline Flush 0.9 % Solution as directed Intravenous     ---    * Senna 8.6 MG Tablet 2 tablets at bedtime as needed Orally Once a day    ---    * Senokot 8.6 MG Tablet 2 tablets at bedtime as needed Orally Once a day    ---    * Simethicone-80  80 MG Tablet Chewable 1 tablet after meals and at bedtime as needed Orally Four times a day    ---    * Tamsulosin HCl 0.4 MG Capsule 1 capsule Orally Once a day    ---    * trazadone 50 mg 1 oral nightly for sleep    ---    * Tums 500 MG Tablet Chewable 1 tablet Orally Once a day    ---    * Tums (calcium carbonate) 200 mg PRN p.o. twice daily    ---    * Ventolin HFA     ---    * Xanax 0.5 MG Tablet 1 tablet Orally Twice a day    ---    * Xarelto 10 MG Tablet 1 tablet with food Orally Once a day    ---    * Zofran 4 MG Tablet as directed Orally Q6H PRN    ---    Not-Taking/PRN    * Bisac-Evac 10 MG Suppository 1 suppository as needed Rectal Once a day    ---    * CeFAZolin Sodium-NaCl 1-0.9 GM/10ML Solution Prefilled Syringe as directed Intravenous 2g Q8H    ---    * Coumadin     ---    * Ferrous Sulfate 325 (65 Fe) MG Tablet 1 tablet Orally Once a day    ---    * Lidocaine 5 % Patch 1 application to affected area as needed Externally Once a day    ---    * Omeprazole 20 MG Capsule Delayed Release 1 capsule Orally twice daily    ---    * Valium 5 MG Tablet 1 tablet as needed Orally Q6H    ---    * Medication List reviewed and reconciled with the patient    ---      **Past Medical History**    ---      Anxiety.        ---    Depression.        ---    Arthritis.        ---    Back problem.        ---    Migraine.        ---    Eating disorder.        ---    Kidney stones.        ---    Hypokalemia.        ---    Otitis media.        ---    Acute pharyngitis.        ---    Disorder of kidney and ureter.        ---    Urinary tract infections (UTIs).        ---  Dysmenorrhea.        ---    Contact dermatitis.        ---    Shoulder joint pain.        ---    Neck pain.        ---    Backache.        ---    DSTYK.        ---    Parethesia.        ---    Edema.        ---    Dyspnea.        ---    Abdominal pain.        ---    Pyelonephritis.        ---    Right hydronephrosis.        ---    Staghorn calculus.         ---    Sepsis .        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        ---    Left MCA aneurysm.        ---    Right ophthalmic ICA aneurysm.        ---    Urinary rentention.        ---      **Surgical History**    ---      Appendectomy 12-09-1984    ---    Casearean section 12/10/87    ---    Ectopic pregnancy surgery December 10, 1999    ---      **Family History**    ---      Mother: deceased    ---    Father: alive, diagnosed with Unspecified heart disease    ---    2 brother(s) , 2 sister(s) . 2 son(s) , 4 daughter(s) .    ---    Mother died of liver scolerosis in Dec 10, 1987. Diabetes paternal grandmother.    ---      **Social History**    ---    Lives with: 6 kids and grandchildren.    Alcohol  Denies.    Illicit drugs: Never.    Caffeine: 4 cups of coffee.    Tobacco  history: Currently smoking Pack Year History: Started at 13 years  until 1/2 pack per year .     **Allergies**    ---      Toradol    ---      **Hospitalization/Major Diagnostic Procedure**    ---      Sepsis ( August-November-December- February) 2017-2018    ---    foley catheter removal/obtained piece 08/2018    ---      **Review of Systems**    ---     _Urology ROS_ :    Constitutional: No fevers/chills/weight loss or general weakness. Eyes: No  decreased visual acuity, loss of vision or diplopia. Integumentary: No rash or  skin lesions, No changes in hair or nail character. Lungs: no shortness of  breath, new frequent cough, no history of asthma. Cardiovascular: No chest  pain, palpitations. Gastrointestinal: No abdominal pain/nausea/vomiting/bowel  changes. Genitourinary: chronic foley d/t retention. Has a nephorsotomy tube  and abcess drainage tube. . Musculoskeletal: No joint\muscle pain, decreased  mobility or joint swelling. Neurological: No headache, dizziness, seizures,  light headedness, memory loss or numbness. Psychiatry: No mood/behavioral  changes, anxiety or depression. Endocrine: No hirsutism or excessive hair  loss, polyuria,  polydipsia or alopecia. Hematologic/Lymphatic: No  lymphadenopathy, easy bruising or abnormal bleeding. Allergic/Immunologic: No  environmental or food allergies.         **Vital Signs**    ---    Pain scale 0, Ht-in 61.50, Wt-lbs 182, BMI 33.83, BP 132/65, HR 107, BSA 1.89,  Ht-cm 156.21, Wt-kg 82.55.      **Physical Examination**    ---     _Urology PE_ :    General Appearance: well-developed, well nourished, NAD, normal secondary  sexual characteristics. HEENT: normocephalic, atraumatic, PERRLA, EOMI,  anicteric, no nasal discharge, sinuses non-tender, oral pharynx within normal  limits. Skin: no bruises, no petechiae, no rashes or lesions. Neck: supple, no  masses, trachea in midline, normal range of motion. Chest  normal AP diameter,  no rib tenderness. Lungs: normal respirations, symmetric excursion with no  accesory muscle use. Back: no CVAT, vertebral column aligned, no sacral Grawn  or dimples, no scoliosis/kyphosis, masses or tenderness. Cardiovascular: RRR,  no murmur, pulses intact. Abdomen:  Has a right nephorsotomy tube and abcess  drainage tube in place.  Abdomen globular obese, soft, benign, non-tender,  non-distended, no palpable masses, no hepato-splenomegaly, no hernias, bladder  not palpable. Extremities: no clubbing, cyanosis or pitting edema. Musculo-  Skeletal: no muscle wasting,  joint swelling or tenderness. Lymphatic: no  cervical, axillary or inguinal adenopathy. Neurologic: oriented x3, no focal  deficits.    _GU Female_ :    Genitalia: Normally developed female genitalia. Pelvic exam: Deferred as per  patient request.         **Assessments**    ---    1\. 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 Her staghorn calculus/proteus c/b  xanthogranulomatous pyelonephritis with perinephric abscesses s/p nephrostomy  and pigtail placement    ---    3\. Hydronephrosis, right - N13.30,  Secondary to Staghorn calculus    ---    4\. Inguinal pain, unspecified laterality - R10.30    ---      **Treatment**    ---      **1\. Emphysematous pyelonephritis of right kidney**    _LAB: Urine Dip POC_    Notes: Open right nephrectomy.        Long discussion regarding plan of management, do agree he requires nephrectomy  to manage recurring UTIs and abscess. She wants to proceed. She has clearance  from neuro with regards to cerebral aneurysm .    ---        **2\. Staghorn calculus**    Notes: As above.        **3\. Hydronephrosis, right**    Notes: As above.        **4\. Others**    Notes: RTC for preop H & P.    .    Due to the complexity of her condition Dr Dorothea Glassman spent 50 minutes with patient  reviewing her medical history and for management, treatment, counselling, and  coordination of care for her condition. 50% of this time was counselling the  patient. .     **Preventive Medicine**    ---      Counseling: Smoking . BMI Management .    ---     **Follow Up**    ---    RTC for preop H & P.    Electronically signed by [name] , MD on 10/30/2018 at 08:34 AM EST    Sign  off status: Completed        * * [facility]    [address]    [place], [place] [address]    Tel: [phone]    Fax: [phone]              * * *         Patient: Emily Ford, Emily Ford DOB: February 20, 1971 Progress Note: [name], MD  10/22/2018    ---    Note generated by eClinicalWorks EMR/PM Software ([email or web address])

## 2018-10-29 ENCOUNTER — Ambulatory Visit: Admit: 2018-10-29 | Payer: No Typology Code available for payment source

## 2018-10-29 ENCOUNTER — Ambulatory Visit

## 2018-10-29 ENCOUNTER — Ambulatory Visit: Admitting: Urology

## 2018-10-29 NOTE — Progress Notes (Signed)
 .  Progress Notes  .  Patient: Emily Ford  Provider: [name]    .  DOB: [birth year] Age: 48 Y Sex: Female  .  PCP: Adah Salvage MD  Date: 10/29/2018  .  --------------------------------------------------------------------------------  .  REASON FOR APPOINTMENT  .  1. 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  2. Urinary retention  .  3. Staghorn calculus  .  4. Severe hydronephrosis  .  5. Inguinal pain  .  HISTORY OF PRESENT ILLNESS  .  Adult Urology:   This is a 48 year old female with a complex medical history  including, but not limited to anxiety, depression, arthritis,  back problem, migraine, eating disorder, kidney stones,  hypokalemia, otitis media, acute pharyngitis, disorder of kidney  and ureter, UTIs, dysmenorrhea, contact dermatitis, shoulder  joint pain, neck pain, backache, DSTYK, paresthesia, edema,  dyspnea, abdominal pain, right hydronephrosis, staghorn calculus,  Inguinal pain and right xanthogranulomatous pyelonephritis..She  is here today for an H & P for Open right nephrectomy for her  right xanthogranulomatous pyelonephritis..Of note, on 08/17/2018,  the patient called the [facility] reporting that she was  discharged from her facility with an indwelling Foley and had  some concerns. She called again on 09/24/2018 to report that she  was cleared for surgery by neurosurgeon and nephrology and now  wants to to discuss surgery options..Her staghorn calculus was  complicated by xanthogranulomatous pyelonephritis with  perinephric abscesses s/p nephrostomy and pigtail placement. She  was recently admitted at [facility]. Leane Call (06/06/18) for about a week  for replacement of abscess drainage tube. She was started on long  term abx for Cefazolin IV. She previously was able to get  infusions at home, but now has to either go to [place] or [facility]. During her [place]. Leane Call (06/06/18) for about a week for  replacement of abscess drainage tube.  While admitted, she was  found to have >1 L of urinary retention and has been unable to  fully empty her bladder since then. 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 revealed there is relatively  decreased right renal uptake. Right kidney takes up approximately  22.3 % of the administered radiopharmaceutical and left kidney  takes up approximately 77.7 %. Evidence of right renal  obstruction..[name] also has a history of a staghorn  calculus. .Renal-Spect in 01/2018 reviewed by [name] revealed  normal renal cortical imaging of the left kidney, without  evidence of perfusion abnormality.She has a history of  UTI/urosepsis but last episode in April 2018, since then she has  been on home IV antibiotics since July 108. He has been sepsis  since that time. .BUN, Cr, GFR:07/2016: 20, 1.07, 68.08/2016: 12,  1.10, 66.10/2017: 19, 1.13, 63.06/2018: 21, 1.01,  65..PSA:11/2016: 0.69.Marland KitchenUrine Culture:07/2016: No  Growth.08/26/2016: <=10,000 CFU/ml Mixed bacterial  flora.09/16/2016: 50,000-99,000 CFU/ml Group B  Streptococcus09/2018: >=100,000 CFU/ml Group B  Streptococcus04/2018: No growth..She voids with good stream and  good control..She has no other complaints. Patient denies gross  hematuria, urethral discharge, pyuria, dysuria, UTI, fever,  chills, night sweats, weight loss and skeletal pain..  .  Risk Assessment:  Is Patient a Fall Risk?  .  Valentino Hue,  fall risk procedures implemented per hospital policy  .  [name]  CURRENT MEDICATIONS  .  Taking Alprazolam 0.5 MG Tablet 1 tablet Orally PRN TID  Taking Ascorbic Acid 500 MG Tablet Chewable 1 tablet Orally Once  a day  Taking Centrum - Tablet as directed Orally  Taking Dulera 200-5 MCG/ACT Aerosol 2 puffs  Inhalation Twice a  day  Taking Ferrous Sulfate 325 (65 Fe) MG Tablet 1 tablet Orally Once  a day  Taking Fioricet 50-325-40 MG Tablet 1 tablet as needed Orally  every 4 hrs  Taking Gabapentin 400 mg Capsule 2 capsule Orally Twice a day  Taking Gas-X 80 MG Tablet Chewable 1 tablet after meals and at  bedtime as needed Orally Four times a day  Taking Meropenem 1 GM Solution Reconstituted as directed  Intravenous every 8 hours  Taking MiraLax - Packet 1 packet mixed with 8 ounces of fluid  Orally Once a day  Taking Narcan 4 MG/0.1ML Liquid Nasally  Taking Neurontin 300 MG Capsule 1 capsule Orally Once a day  Taking Niferex - Tablet as directed Orally  Taking Omeprazole 20 MG Capsule Delayed Release 1 capsule Orally  twice daily  Taking Paroxetine HCl 20 MG Tablet 1 tablet in the morning Orally  Once a day  Taking Paxil 20 MG Tablet 1 tablet in the morning Orally Once a  day  Taking Percocet 5-325 MG Tablet 1 tablet as needed Orally every 8  hrs PRN  Taking Polyethylene Glycol 3350 - Powder  Taking Proventil  Taking Pulmicort 0.5 MG/2ML Suspension 2 ml Inhalation BID  Taking Rivaroxaban 10 MG Tablet 1 tablet with food Orally Once a  day  Taking Saline Flush 0.9 % Solution as directed Intravenous  Taking Senna 8.6 MG Tablet 2 tablets at bedtime as needed Orally  Once a day  Taking Senokot 8.6 MG Tablet 2 tablets at bedtime as needed  Orally Once a day  Taking Tamsulosin HCl 0.4 MG Capsule 1 capsule Orally Once a day  Taking trazadone 50 mg 1 oral nightly for sleep  Taking Tums 500 MG Tablet Chewable 1 tablet Orally Once a day  Taking Valium 5 MG Tablet 1 tablet as needed Orally Q6H  Taking Ventolin HFA  Taking Xanax 0.5 MG Tablet 1 tablet Orally Twice a day  Taking Xarelto 10 MG Tablet 2 tablet with food Orally Once a day  Taking Zofran 4 MG Tablet as directed Orally Q6H PRN  Not-Taking/PRN Acetaminophen 325 MG Capsule 1 capsule as needed  Orally every 4 hrs  Not-Taking/PRN Bisac-Evac 10 MG Suppository 1 suppository  as  needed Rectal Once a day  Medication List reviewed and reconciled with the patient  .  PAST MEDICAL HISTORY  .  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  ALLERGIES  .  Toradol  .  SURGICAL HISTORY  .  Appendectomy 1986  Casearean section 1989  Ectopic pregnancy surgery 2001  .  FAMILY HISTORY  .  Mother: deceased  Father: alive, diagnosed with Unspecified  heart disease  2 brother(s) , 2 sister(s) . 2 son(s) , 4 daughter(s) .  Mother died of liver scolerosis in 43. Diabetes paternal  grandmother.  .  SOCIAL HISTORY  .  [name]  Lives with: 6 kids and grandchildren.  .  Alcohol Denies.  .  Illicit drugs: Never.  .  Caffeine: 4 cups of coffee.  .  Tobaccohistory:Currently smoking Pack Year History: Started at 13  years until 1/2 pack per year .  [name]  HOSPITALIZATION/MAJOR DIAGNOSTIC PROCEDURE  .  Sepsis ( August-November-December- February) 2017-2018  foley catheter removal/obtained piece 08/2018  .  REVIEW OF SYSTEMS  .  Urology ROS:  .  Constitutional:    No fevers/chills/weight loss or general  weakness . Eyes:    No decreased visual acuity, loss of vision or  diplopia . Integumentary:    No rash or skin lesions, No changes  in hair or nail character . Lungs:    no shortness of breath, new  frequent cough, no history of asthma . Cardiovascular:    No  chest pain, palpitations . Gastrointestinal:    No abdominal  pain/nausea/vomiting/bowel changes . Genitourinary:    chronic  foley d/t retention. Has a nephorsotomy tube and abcess drainage  tube.  .  Musculoskeletal:    No joint\muscle pain, decreased  mobility or joint swelling . Neurological:    No headache,  dizziness, seizures, light headedness, memory loss or numbness .  Psychiatry:    No mood/behavioral changes, anxiety or depression  . Endocrine:    No hirsutism or excessive hair loss, polyuria,  polydipsia or alopecia . Hematologic/Lymphatic:    No  lymphadenopathy, easy bruising or abnormal bleeding .  Allergic/Immunologic:    No environmental or food allergies .  [name]  VITAL SIGNS  .  Pain scale 9, Ht-in 61.50, Wt-lbs 182, BMI 33.83, BP 106/61, HR  101, BSA 1.89, Ht-cm 156.21, Wt-kg 82.55.  [name]  PHYSICAL EXAMINATION  .  Urology PE:  General Appearance:  well-developed, well nourished, NAD, normal  secondary sexual characteristics. HEENT:  normocephalic,  atraumatic, PERRLA, EOMI, anicteric, no nasal discharge, sinuses  non-tender, oral pharynx within normal limits. Skin:  no bruises,  no petechiae, no rashes or lesions. Neck:  supple, no masses,  trachea in midline, normal range of motion. Chest  normal AP  diameter, no rib tenderness. Lungs:  normal respirations,  symmetric excursion with no accesory muscle use. Back:  no CVAT,  vertebral column aligned, no sacral [place] or dimples, no  scoliosis/kyphosis, masses or tenderness. Cardiovascular:  RRR,  no murmur, pulses intact. Abdomen:  Has a right nephorsotomy tube  and abcess drainage tube in place. Abdomen globular obese, soft,  benign, non-tender, non-distended, no palpable masses, no  hepato-splenomegaly, no hernias, bladder not palpable.  Extremities:  no clubbing, cyanosis or pitting edema.  Musculo-Skeletal:  no muscle wasting, joint swelling or  tenderness. Lymphatic:  no cervical, axillary or inguinal  adenopathy. Neurologic:  oriented x3, no focal deficits.  GU Female:  Genitalia:  Normally developed female genitalia. Pelvic exam:   Deferred as per patient request.  .  ASSESSMENTS  .  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 Her staghorn calculus/proteus  c/b xanthogranulomatous pyelonephritis with perinephric abscesses  s/p nephrostomy and pigtail placement  .  Hydronephrosis, right - N13.30, Secondary to Staghorn calculus  .  Inguinal pain, unspecified laterality - R10.30  .  TREATMENT  .  Emphysematous pyelonephritis of right kidney  LAB: Urine Dip POC  .  Notes: Open right nephrectomy.Long discussion regarding plan of  management, do agree he requires nephrectomy to manage recurring  UTIs and abscess. He has discussed with local urology (Dr.  Merilyn Baba), will decide how she wants to proceed. Prior to  proceeding with surgical option will need clearance from neuro  with regards to cerebral aneurysm .  .  .  Staghorn calculus  Notes: As above.  .  .  Hydronephrosis, right  Notes: As above.  .  .  Others  Notes: RTC 2 weeks after the nephrectomy..Due to the complexity  of her condition Dr Dorothea Glassman spent 50 minutes with patient  reviewing her medical history and for management, treatment,  counselling, and coordination of care for her condition. 50% of  this time was counselling the patient. .  .  PREVENTIVE MEDICINE  .  Counseling:  Smoking   . BMI Management   .  [name]  FOLLOW UP  .  RTC 2 Weeks post nephrectomy  .  Electronically signed by [name] , MD on  11/02/2018 at 10:33 AM EDT  .  ADDENDUM:  11/02/2018 12:38 PM ASK, MARIEPET > Per [name] stop blood thinners 5 days before the procedure March 19  11/02/2018 12:43 PM ASK, MARIEPET > Facility was informed and reminded about holding the Xarelto on 11-12-2018 5 days before the procedure per Dr Dorothea Glassman tel # [phone] spoke to the patient and nurse Sarah  .  Document electronically signed by [name]    .

## 2018-10-29 NOTE — Progress Notes (Signed)
 * * *      Emily Ford**    ------    23 Y old Female, DOB: December 18, 1970, External MRN: [id number]    Account Number: [id number]    [address], [place], [place]    Home: [phone]    Insurance: [id number] [employer] [facility]    PCP: Adah Salvage, MD Referring: Adah Salvage, MD    Appointment Facility: [facility]        * * *    10/29/2018 Progress Note: Emily Salvo, MD **CHN#:** [id number]    ------    ---       **Reason for Appointment**    ---      1\. 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 Urinary retention    ---    3\. Staghorn calculus    ---    4\. Severe hydronephrosis    ---    5\. Inguinal pain    ---      **History of Present Illness**    ---     _Adult Urology_ :    This is a 48 year old female with a complex medical history including, but not  limited to anxiety, depression, arthritis, back problem, migraine, eating  disorder, kidney stones, hypokalemia, otitis media, acute pharyngitis,  disorder of kidney and ureter, UTIs, dysmenorrhea, contact dermatitis,  shoulder joint pain, neck pain, backache, DSTYK, paresthesia, edema, dyspnea,  abdominal pain, right hydronephrosis, staghorn calculus, Inguinal pain and  right xanthogranulomatous pyelonephritis.    .    She is here today for an H & P for Open right nephrectomy for her right  xanthogranulomatous pyelonephritis.    .    Of note, on 08/17/2018, the patient called the [facility] reporting that she  was discharged from her facility with an indwelling Foley and had some  concerns. She called again on 09/24/2018 to report that she was cleared for  surgery by neurosurgeon and nephrology and now wants to to discuss surgery  options.    .    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 She was  recently admitted at [facility]. Leane Call (06/06/18) for about a week  for replacement of  abscess drainage tube. She was started on long term abx for Cefazolin IV. She  previously was able to get infusions at home, but now has to either go to  [place] or [facility]. During her [place]. Leane Call (06/06/18) for about a week  for replacement of abscess drainage tube. While admitted, she was found to  have >1 L of urinary retention and has been unable to fully empty her bladder  since then. She currently has a chronic Foley Catheter and was referred to the  [facility] for further evaluation.    .    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    She also has a history of right hydronephrosis.    [name]    [facility] Kidney in 03/2016 (Report Only) reviewed by [name] revealed there is  relatively decreased right renal uptake. Right kidney takes up approximately  22.3 % of the administered radiopharmaceutical and left kidney takes up  approximately 77.7 %. Evidence of right renal obstruction.    .    Ms. Fasnacht also has a history  of a staghorn calculus.    .    Renal-Spect in 01/2018 reviewed by [name] revealed normal renal cortical  imaging of the left kidney, without evidence of perfusion abnormality.    She has a history of UTI/urosepsis but last episode in April 2018, since then  she has been on home IV antibiotics since July 108. He has been sepsis since  that time.    .    BUN, Cr, GFR:    07/2016: 20, 1.07, 68.    08/2016: 12, 1.10, 66.    10/2017: 19, 1.13, 63.    06/2018: 21, 1.01, 65.    [name]    PSA:    11/2016: 0.69.    [name]    Urine Culture:    07/2016: No Growth.    08/26/2016: <=10,000 CFU/ml Mixed bacterial flora.    09/16/2016: 50,000-99,000 CFU/ml Group B Streptococcus    04/2017: >=100,000 CFU/ml Group B Streptococcus    11/2016: No growth.    .    She voids with good stream and good control.    .    She has no other complaints. Patient denies gross hematuria, urethral  discharge, pyuria, dysuria, UTI, fever, chills,  night sweats, weight loss and  skeletal pain.    [name] Assessment_ :    Is Patient a Fall Risk? : Yes, fall risk procedures implemented per hospital  policy .     **Current Medications**    ---    Taking    * Alprazolam 0.5 MG Tablet 1 tablet Orally PRN TID    ---    * Ascorbic Acid 500 MG Tablet Chewable 1 tablet Orally Once a day    ---    * Centrum - Tablet as directed Orally     ---    * Dulera 200-5 MCG/ACT Aerosol 2 puffs Inhalation Twice a day    ---    * Ferrous Sulfate 325 (65 Fe) MG Tablet 1 tablet Orally Once a day    ---    * Fioricet 50-325-40 MG Tablet 1 tablet as needed Orally every 4 hrs    ---    * Gabapentin 400 mg Capsule 2 capsule Orally Twice a day    ---    * Gas-X 80 MG Tablet Chewable 1 tablet after meals and at bedtime as needed Orally Four times a day    ---    * Meropenem 1 GM Solution Reconstituted as directed Intravenous every 8 hours    ---    * MiraLax - Packet 1 packet mixed with 8 ounces of fluid Orally Once a day    ---    * Narcan 4 MG/0.1ML Liquid Nasally     ---    * Neurontin 300 MG Capsule 1 capsule Orally Once a day    ---    * Niferex - Tablet as directed Orally     ---    * Omeprazole 20 MG Capsule Delayed Release 1 capsule Orally twice daily    ---    * Paroxetine HCl 20 MG Tablet 1 tablet in the morning Orally Once a day    ---    * Paxil 20 MG Tablet 1 tablet in the morning Orally Once a day    ---    * Percocet 5-325 MG Tablet 1 tablet as needed Orally every 8 hrs PRN    ---    * Polyethylene Glycol 3350 - Powder     ---    *  Proventil     ---    * Pulmicort 0.5 MG/2ML Suspension 2 ml Inhalation BID    ---    * Rivaroxaban 10 MG Tablet 1 tablet with food Orally Once a day    ---    * Saline Flush 0.9 % Solution as directed Intravenous     ---    * Senna 8.6 MG Tablet 2 tablets at bedtime as needed Orally Once a day    ---    * Senokot 8.6 MG Tablet 2 tablets at bedtime as needed Orally Once a day    ---    * Tamsulosin HCl 0.4 MG Capsule 1 capsule Orally Once a  day    ---    * trazadone 50 mg 1 oral nightly for sleep    ---    * Tums 500 MG Tablet Chewable 1 tablet Orally Once a day    ---    * Valium 5 MG Tablet 1 tablet as needed Orally Q6H    ---    * Ventolin HFA     ---    * Xanax 0.5 MG Tablet 1 tablet Orally Twice a day    ---    * Xarelto 10 MG Tablet 2 tablet with food Orally Once a day    ---    * Zofran 4 MG Tablet as directed Orally Q6H PRN    ---    Not-Taking/PRN    * Acetaminophen 325 MG Capsule 1 capsule as needed Orally every 4 hrs    ---    * Bisac-Evac 10 MG Suppository 1 suppository as needed Rectal Once a day    ---    * Medication List reviewed and reconciled with the patient    ---      **Past Medical History**    ---      Anxiety.        ---    Depression.        ---    Arthritis.        ---    Back problem.        ---    Migraine.        ---    Eating disorder.        ---    Kidney stones.        ---    Hypokalemia.        ---    Otitis media.        ---    Acute pharyngitis.        ---    Disorder of kidney and ureter.        ---    Urinary tract infections (UTIs).        ---    Dysmenorrhea.        ---    Contact dermatitis.        ---    Shoulder joint pain.        ---    Neck pain.        ---    Backache.        ---    DSTYK.        ---    Parethesia.        ---    Edema.        ---    Dyspnea.        ---    Abdominal pain.        ---  Pyelonephritis.        ---    Right hydronephrosis.        ---    Staghorn calculus.        ---    Sepsis .        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        ---    Left MCA aneurysm.        ---    Right ophthalmic ICA aneurysm.        ---    Urinary rentention.        ---      **Surgical History**    ---      Appendectomy 1984/12/18    ---    Casearean section 19-Dec-1987    ---    Ectopic pregnancy surgery 1999/12/19    ---      **Family History**    ---      Mother: deceased    ---    Father: alive, diagnosed with Unspecified heart disease    ---    2 brother(s) , 2 sister(s) . 2 son(s) , 4 daughter(s) .     ---    Mother died of liver scolerosis in 1987-12-19. Diabetes paternal grandmother.    ---      **Social History**    ---    Lives with: 6 kids and grandchildren.    Alcohol  Denies.    Illicit drugs: Never.    Caffeine: 4 cups of coffee.    Tobacco  history: Currently smoking Pack Year History: Started at 13 years  until 1/2 pack per year .     **Allergies**    ---      Toradol    ---      **Hospitalization/Major Diagnostic Procedure**    ---      Sepsis ( August-November-December- February) 2017-2018    ---    foley catheter removal/obtained piece 08/2018    ---      **Review of Systems**    ---     _Urology ROS_ :    Constitutional: No fevers/chills/weight loss or general weakness. Eyes: No  decreased visual acuity, loss of vision or diplopia. Integumentary: No rash or  skin lesions, No changes in hair or nail character. Lungs: no shortness of  breath, new frequent cough, no history of asthma. Cardiovascular: No chest  pain, palpitations. Gastrointestinal: No abdominal pain/nausea/vomiting/bowel  changes. Genitourinary: chronic foley d/t retention. Has a nephorsotomy tube  and abcess drainage tube. . Musculoskeletal: No joint\muscle pain, decreased  mobility or joint swelling. Neurological: No headache, dizziness, seizures,  light headedness, memory loss or numbness. Psychiatry: No mood/behavioral  changes, anxiety or depression. Endocrine: No hirsutism or excessive hair  loss, polyuria, polydipsia or alopecia. Hematologic/Lymphatic: No  lymphadenopathy, easy bruising or abnormal bleeding. Allergic/Immunologic: No  environmental or food allergies.         **Vital Signs**    ---    Pain scale 9, Ht-in 61.50, Wt-lbs 182, BMI 33.83, BP 106/61, HR 101, BSA 1.89,  Ht-cm 156.21, Wt-kg 82.55.      **Physical Examination**    ---     _Urology PE_ :    General Appearance: well-developed, well nourished, NAD, normal secondary  sexual characteristics. HEENT: normocephalic, atraumatic, PERRLA, EOMI,  anicteric, no nasal  discharge, sinuses non-tender, oral pharynx within normal  limits. Skin: no bruises, no petechiae, no rashes or lesions. Neck: supple, no  masses, trachea in midline, normal  range of motion. Chest  normal AP diameter,  no rib tenderness. Lungs: normal respirations, symmetric excursion with no  accesory muscle use. Back: no CVAT, vertebral column aligned, no sacral [place]  or dimples, no scoliosis/kyphosis, masses or tenderness. Cardiovascular: RRR,  no murmur, pulses intact. Abdomen:  Has a right nephorsotomy tube and abcess  drainage tube in place.  Abdomen globular obese, soft, benign, non-tender,  non-distended, no palpable masses, no hepato-splenomegaly, no hernias, bladder  not palpable. Extremities: no clubbing, cyanosis or pitting edema. Musculo-  Skeletal: no muscle wasting,  joint swelling or tenderness. Lymphatic: no  cervical, axillary or inguinal adenopathy. Neurologic: oriented x3, no focal  deficits.    _GU Female_ :    Genitalia: Normally developed female genitalia. Pelvic exam: Deferred as per  patient request.         **Assessments**    ---    1\. 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 Her staghorn calculus/proteus c/b  xanthogranulomatous pyelonephritis with perinephric abscesses s/p nephrostomy  and pigtail placement    ---    3\. Hydronephrosis, right - N13.30, Secondary to Staghorn calculus    ---    4\. Inguinal pain, unspecified laterality - R10.30    ---      **Treatment**    ---      **1\. Emphysematous pyelonephritis of right kidney**    _LAB: Urine Dip POC_    Notes: Open right nephrectomy.        Long discussion regarding plan of management, do agree he requires nephrectomy  to manage recurring UTIs and abscess. He has discussed with local urology (Dr.  Merilyn Baba), will decide how she wants to proceed. Prior to proceeding with  surgical option will need clearance from neuro with  regards to cerebral  aneurysm .    ---        **2\. Staghorn calculus**    Notes: As above.        **3\. Hydronephrosis, right**    Notes: As above.        **4\. Others**    Notes: RTC 2 weeks after the nephrectomy.    .    Due to the complexity of her condition Dr Dorothea Glassman spent 50 minutes with patient  reviewing her medical history and for management, treatment, counselling, and  coordination of care for her condition. 50% of this time was counselling the  patient. .     **Preventive Medicine**    ---      Counseling: Smoking . BMI Management .    ---     **Follow Up**    ---    RTC 2 Weeks post nephrectomy    Electronically signed by Emily Ford , MD on 11/02/2018 at 10:33 AM EDT    Sign off status: Completed        * * *        [facility]    [address]    [place], [place] [address]    Tel: [phone]    Fax: [phone]              * * *         Patient: Emily Ford, Emily Ford DOB: 1971/05/08 Progress Note: Emily Salvo, MD  10/29/2018    ---    Note generated by eClinicalWorks EMR/PM Software ([email or web address])

## 2018-10-29 NOTE — Progress Notes (Signed)
 .  Progress Notes  .  Patient: Emily Ford  Provider: [name]    .  DOB: 1971-05-13 Age: 48 Y Sex: Female  .  PCP: Adah Salvage MD  Date: 10/29/2018  .  --------------------------------------------------------------------------------  .  REASON FOR APPOINTMENT  .  1. 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  2. Urinary retention  .  3. Staghorn calculus  .  4. Severe hydronephrosis  .  5. Inguinal pain  .  HISTORY OF PRESENT ILLNESS  .  Adult Urology:   This is a 48 year old female with a complex medical history  including, but not limited to anxiety, depression, arthritis,  back problem, migraine, eating disorder, kidney stones,  hypokalemia, otitis media, acute pharyngitis, disorder of kidney  and ureter, UTIs, dysmenorrhea, contact dermatitis, shoulder  joint pain, neck pain, backache, DSTYK, paresthesia, edema,  dyspnea, abdominal pain, right hydronephrosis, staghorn calculus,  Inguinal pain and right xanthogranulomatous pyelonephritis..She  is here today for an H & P for Open right nephrectomy for her  right xanthogranulomatous pyelonephritis..Of note, on 08/17/2018,  the patient called the [facility] reporting that she was  discharged from her facility with an indwelling Foley and had  some concerns. She called again on 09/24/2018 to report that she  was cleared for surgery by neurosurgeon and nephrology and now  wants to to discuss surgery options..Her staghorn calculus was  complicated by xanthogranulomatous pyelonephritis with  perinephric abscesses s/p nephrostomy and pigtail placement. She  was recently admitted at [facility]. Leane Call (06/06/18) for about a week  for replacement of abscess drainage tube. She was started on long  term abx for Cefazolin IV. She previously was able to get  infusions at home, but now has to either go to [place] or [facility]. During her [facility]. Leane Call (06/06/18) for about a week for  replacement of abscess drainage tube.  While admitted, she was  found to have >1 L of urinary retention and has been unable to  fully empty her bladder since then. 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 revealed there is relatively  decreased right renal uptake. Right kidney takes up approximately  22.3 % of the administered radiopharmaceutical and left kidney  takes up approximately 77.7 %. Evidence of right renal  obstruction..Ms. Mehra also has a history of a staghorn  calculus. .Renal-Spect in 01/2018 reviewed by [name] revealed  normal renal cortical imaging of the left kidney, without  evidence of perfusion abnormality.She has a history of  UTI/urosepsis but last episode in April 2018, since then she has  been on home IV antibiotics since July 108. He has been sepsis  since that time. .BUN, Cr, GFR:07/2016: 20, 1.07, 68.08/2016: 12,  1.10, 66.10/2017: 19, 1.13, 63.06/2018: 21, 1.01,  65..PSA:11/2016: 0.69.Marland KitchenUrine Culture:07/2016: No  Growth.08/26/2016: <=10,000 CFU/ml Mixed bacterial  flora.09/16/2016: 50,000-99,000 CFU/ml Group B  Streptococcus09/2018: >=100,000 CFU/ml Group B  Streptococcus04/2018: No growth..She voids with good stream and  good control..She has no other complaints. Patient denies gross  hematuria, urethral discharge, pyuria, dysuria, UTI, fever,  chills, night sweats, weight loss and skeletal pain..  .  Risk Assessment:  Is Patient a Fall Risk?  .  Valentino Hue,  fall risk procedures implemented per hospital policy  .  [name]  CURRENT MEDICATIONS  .  Taking Alprazolam 0.5 MG Tablet 1 tablet Orally PRN TID  Taking Ascorbic Acid 500 MG Tablet Chewable 1 tablet Orally Once  a day  Taking Centrum - Tablet as directed Orally  Taking Dulera 200-5 MCG/ACT Aerosol 2 puffs  Inhalation Twice a  day  Taking Ferrous Sulfate 325 (65 Fe) MG Tablet 1 tablet Orally Once  a day  Taking Fioricet 50-325-40 MG Tablet 1 tablet as needed Orally  every 4 hrs  Taking Gabapentin 400 mg Capsule 2 capsule Orally Twice a day  Taking Gas-X 80 MG Tablet Chewable 1 tablet after meals and at  bedtime as needed Orally Four times a day  Taking Meropenem 1 GM Solution Reconstituted as directed  Intravenous every 8 hours  Taking MiraLax - Packet 1 packet mixed with 8 ounces of fluid  Orally Once a day  Taking Narcan 4 MG/0.1ML Liquid Nasally  Taking Neurontin 300 MG Capsule 1 capsule Orally Once a day  Taking Niferex - Tablet as directed Orally  Taking Omeprazole 20 MG Capsule Delayed Release 1 capsule Orally  twice daily  Taking Paroxetine HCl 20 MG Tablet 1 tablet in the morning Orally  Once a day  Taking Paxil 20 MG Tablet 1 tablet in the morning Orally Once a  day  Taking Percocet 5-325 MG Tablet 1 tablet as needed Orally every 8  hrs PRN  Taking Polyethylene Glycol 3350 - Powder  Taking Proventil  Taking Pulmicort 0.5 MG/2ML Suspension 2 ml Inhalation BID  Taking Rivaroxaban 10 MG Tablet 1 tablet with food Orally Once a  day  Taking Saline Flush 0.9 % Solution as directed Intravenous  Taking Senna 8.6 MG Tablet 2 tablets at bedtime as needed Orally  Once a day  Taking Senokot 8.6 MG Tablet 2 tablets at bedtime as needed  Orally Once a day  Taking Tamsulosin HCl 0.4 MG Capsule 1 capsule Orally Once a day  Taking trazadone 50 mg 1 oral nightly for sleep  Taking Tums 500 MG Tablet Chewable 1 tablet Orally Once a day  Taking Valium 5 MG Tablet 1 tablet as needed Orally Q6H  Taking Ventolin HFA  Taking Xanax 0.5 MG Tablet 1 tablet Orally Twice a day  Taking Xarelto 10 MG Tablet 2 tablet with food Orally Once a day  Taking Zofran 4 MG Tablet as directed Orally Q6H PRN  Not-Taking/PRN Acetaminophen 325 MG Capsule 1 capsule as needed  Orally every 4 hrs  Not-Taking/PRN Bisac-Evac 10 MG Suppository 1 suppository  as  needed Rectal Once a day  Medication List reviewed and reconciled with the patient  .  PAST MEDICAL HISTORY  .  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  ALLERGIES  .  Toradol  .  SURGICAL HISTORY  .  Appendectomy 1986  Casearean section 1989  Ectopic pregnancy surgery 2001  .  FAMILY HISTORY  .  Mother: deceased  Father: alive, diagnosed with Unspecified  heart disease  2 brother(s) , 2 sister(s) . 2 son(s) , 4 daughter(s) .  Mother died of liver scolerosis in 39. Diabetes paternal  grandmother.  .  SOCIAL HISTORY  .  [name]  Lives with: 6 kids and grandchildren.  .  Alcohol Denies.  .  Illicit drugs: Never.  .  Caffeine: 4 cups of coffee.  .  Tobaccohistory:Currently smoking Pack Year History: Started at 13  years until 1/2 pack per year .  [name]  HOSPITALIZATION/MAJOR DIAGNOSTIC PROCEDURE  .  Sepsis ( August-November-December- February) 2017-2018  foley catheter removal/obtained piece 08/2018  .  REVIEW OF SYSTEMS  .  Urology ROS:  .  Constitutional:    No fevers/chills/weight loss or general  weakness . Eyes:    No decreased visual acuity, loss of vision or  diplopia . Integumentary:    No rash or skin lesions, No changes  in hair or nail character . Lungs:    no shortness of breath, new  frequent cough, no history of asthma . Cardiovascular:    No  chest pain, palpitations . Gastrointestinal:    No abdominal  pain/nausea/vomiting/bowel changes . Genitourinary:    chronic  foley d/t retention. Has a nephorsotomy tube and abcess drainage  tube.  .  Musculoskeletal:    No joint\muscle pain, decreased  mobility or joint swelling . Neurological:    No headache,  dizziness, seizures, light headedness, memory loss or numbness .  Psychiatry:    No mood/behavioral changes, anxiety or depression  . Endocrine:    No hirsutism or excessive hair loss, polyuria,  polydipsia or alopecia . Hematologic/Lymphatic:    No  lymphadenopathy, easy bruising or abnormal bleeding .  Allergic/Immunologic:    No environmental or food allergies .  [name]  VITAL SIGNS  .  Pain scale 9, Ht-in 61.50, Wt-lbs 182, BMI 33.83, BP 106/61, HR  101, BSA 1.89, Ht-cm 156.21, Wt-kg 82.55.  [name]  PHYSICAL EXAMINATION  .  Urology PE:  General Appearance:  well-developed, well nourished, NAD, normal  secondary sexual characteristics. HEENT:  normocephalic,  atraumatic, PERRLA, EOMI, anicteric, no nasal discharge, sinuses  non-tender, oral pharynx within normal limits. Skin:  no bruises,  no petechiae, no rashes or lesions. Neck:  supple, no masses,  trachea in midline, normal range of motion. Chest  normal AP  diameter, no rib tenderness. Lungs:  normal respirations,  symmetric excursion with no accesory muscle use. Back:  no CVAT,  vertebral column aligned, no sacral [place] or dimples, no  scoliosis/kyphosis, masses or tenderness. Cardiovascular:  RRR,  no murmur, pulses intact. Abdomen:  Has a right nephorsotomy tube  and abcess drainage tube in place. Abdomen globular obese, soft,  benign, non-tender, non-distended, no palpable masses, no  hepato-splenomegaly, no hernias, bladder not palpable.  Extremities:  no clubbing, cyanosis or pitting edema.  Musculo-Skeletal:  no muscle wasting, joint swelling or  tenderness. Lymphatic:  no cervical, axillary or inguinal  adenopathy. Neurologic:  oriented x3, no focal deficits.  GU Female:  Genitalia:  Normally developed female genitalia. Pelvic exam:   Deferred as per patient request.  .  ASSESSMENTS  .  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 Her staghorn calculus/proteus  c/b xanthogranulomatous pyelonephritis with perinephric abscesses  s/p nephrostomy and pigtail placement  .  Hydronephrosis, right - N13.30, Secondary to Staghorn calculus  .  Inguinal pain, unspecified laterality - R10.30  .  TREATMENT  .  Emphysematous pyelonephritis of right kidney  LAB: Urine Dip POC  .  Notes: Open right nephrectomy.Long discussion regarding plan of  management, do agree he requires nephrectomy to manage recurring  UTIs and abscess. He has discussed with local urology (Dr.  Merilyn Baba), will decide how she wants to proceed. Prior to  proceeding with surgical option will need clearance from neuro  with regards to cerebral aneurysm .  .  .  Staghorn calculus  Notes: As above.  .  .  Hydronephrosis, right  Notes: As above.  .  .  Others  Notes: RTC 2 weeks after the nephrectomy..Due to the complexity  of her condition Dr Dorothea Glassman spent 50 minutes with patient  reviewing her medical history and for management, treatment,  counselling, and coordination of care for her condition. 50% of  this time was counselling the patient. .  .  PREVENTIVE MEDICINE  .  Counseling:  Smoking   . BMI Management   .  [name]  FOLLOW UP  .  RTC 2 Weeks post nephrectomy  .  Electronically signed by [name] , MD on  11/02/2018 at 10:33 AM EDT  .  Document electronically signed by [name]    .

## 2018-10-29 NOTE — Progress Notes (Signed)
****    ---    **  Patient:** NOLITA, KUTTER     **Account Number:** [id number] **External MRN:** [id number]  **Provider:**  Appointment Resource     **DOB:** 01-07-1971 **Age:** 73 Y **Sex:** Female  **Date:** 10/29/2018     **Phone:** [phone]     **Address:** [address], [place], [place]     **Pcp:** Christos Deveron Furlong, MD        * * *        **Subjective:**        ---      **Chief Complaints:**    ------      1\. T86.3 DOS 11/03/18.    ------     **Medical History:**        ------        **Objective:**        ---         **Assessment:**        ---         **Plan:**        ---        ------    ---    ---          **Provider:** Appointment Resource    ---     **Patient:** [name] **DOB:** 11/20/1970 **Date:** 10/29/2018    ---    Electronically signed by Eduardo Osier on 10/29/2018 at 11:28 AM  EST    Sign off status: Completed

## 2018-11-12 ENCOUNTER — Ambulatory Visit: Admitting: Urology

## 2018-11-12 NOTE — Progress Notes (Signed)
* * *      **  Emily Ford**    ------    77 Y old Female, DOB: 04-05-1971    [address] [name] [place], [place] [address]    Home: [phone]    Provider: [name]        * * *    Telephone Encounter    ---    Answered by  [name] Date: 11/12/2018       Time: 08:08 AM    Caller  [name] from [facility]    ------            Reason  Surgery date            Message                     [name] from [facility] is inquiring the surgery date for pt. Best callback is  [phone]             [name]                Action Taken                     [name],[name]  11/12/2018 8:09:50 AM >      Derinda Late  11/12/2018 10:17:52 AM > spoke with carolone gave sate in time                     * * *                ---          * * *         Patient: Emily Ford DOB: [birth year] Provider: [name] 11/12/2018    ---    Note generated by eClinicalWorks EMR/PM Software ([email or web address])

## 2018-11-17 ENCOUNTER — Observation Stay
Admission: RE | Admit: 2018-11-17 | Discharge: 2018-11-20 | Disposition: A | Discharge status: Residential Facility | Payer: No Typology Code available for payment source

## 2018-11-17 ENCOUNTER — Inpatient Hospital Stay
Admit: 2018-11-17 | Disposition: A | Discharge status: Nursing Home (Includes ICF) | Source: Ambulatory Visit | Attending: Urology | Admitting: Urology

## 2018-11-17 LAB — HX CHEM-OTHER
HX CALCIUM (CA): 7.7 mg/dL — ABNORMAL LOW (ref 8.5–10.5)
HX MAGNESIUM: 2 mg/dL (ref 1.6–2.6)
HX PHOSPHORUS: 3.5 mg/dL (ref 2.7–4.5)

## 2018-11-17 LAB — HX CHEM-PANELS
HX ANION GAP: 6 (ref 3–14)
HX ANION GAP: 10 (ref 3–14)
HX BLOOD UREA NITROGEN: 20 mg/dL (ref 6–24)
HX CHLORIDE (CL): 106 meq/L (ref 98–110)
HX CHLORIDE (CL): 107 meq/L (ref 98–110)
HX CO2: 26 meq/L (ref 20–30)
HX CO2: 23 meq/L (ref 20–30)
HX CREATININE (CR): 0.83 mg/dL (ref 0.57–1.30)
HX GFR, AFRICAN AMERICAN: 95 mL/min/{1.73_m2}
HX GFR, NON-AFRICAN AMERICAN: 82 mL/min/{1.73_m2}
HX GLUCOSE: 145 mg/dL — ABNORMAL HIGH (ref 70–139)
HX POTASSIUM (K): 5.7 meq/L — ABNORMAL HIGH (ref 3.6–5.1)
HX POTASSIUM (K): 5.4 meq/L — ABNORMAL HIGH (ref 3.6–5.1)
HX SODIUM (NA): 138 meq/L (ref 135–145)
HX SODIUM (NA): 140 meq/L (ref 135–145)

## 2018-11-17 LAB — HX TRANSFUSION
HX ABO RH CONFIRMATORY TYPE, MANUAL: B POS
HX ABO-RH INTERPRETATION (GEL): B POS
HX ANTIBODY SCREEN (GEL): NEGATIVE

## 2018-11-17 LAB — HX HEM-ROUTINE
HX BASO #: 0 10*3/uL (ref 0.0–0.2)
HX BASO: 0 %
HX EOSIN #: 0.1 10*3/uL (ref 0.0–0.5)
HX EOSIN: 0 %
HX HCT: 39 % (ref 32.0–45.0)
HX HGB: 12.6 g/dL (ref 11.0–15.0)
HX IMMATURE GRANULOCYTE#: 0.1 10*3/uL (ref 0.0–0.1)
HX IMMATURE GRANULOCYTE: 0 %
HX LYMPH #: 0.9 10*3/uL — ABNORMAL LOW (ref 1.0–4.0)
HX LYMPH: 6 %
HX MCH: 30.3 pg (ref 26.0–34.0)
HX MCHC: 32.3 g/dL (ref 32.0–36.0)
HX MCV: 93.8 fL (ref 80.0–98.0)
HX MONO #: 0.6 10*3/uL (ref 0.2–0.8)
HX MONO: 4 %
HX MPV: 9.4 fL (ref 9.1–11.7)
HX NEUT #: 13.8 10*3/uL — ABNORMAL HIGH (ref 1.5–7.5)
HX NRBC #: 0 10*3/uL
HX NUCLEATED RBC: 0 %
HX PLT: 241 10*3/uL (ref 150–400)
HX RBC BLOOD COUNT: 4.16 M/uL (ref 3.70–5.00)
HX RDW: 17.7 % — ABNORMAL HIGH (ref 11.5–14.5)
HX SEG NEUT: 89 %
HX WBC: 15.5 10*3/uL — ABNORMAL HIGH (ref 4.0–11.0)

## 2018-11-17 LAB — HX DIABETES: HX GLUCOSE: 145 mg/dL — ABNORMAL HIGH (ref 70–139)

## 2018-11-17 NOTE — Op Note (Signed)
 Patient    Emily Ford, Emily Ford         Med Rec #:  [id number]  Name:  Operation  11/17/2018                Pt.  Dt:                                  Location:  .  [name]                               OPERATIVE REPORT  .  SURGEON: [name], M.D.  ASSISTANT: Eudelia Bunch, M.D.  PREOPERATIVE DIAGNOSIS: Chronic right xanthogranulomatous pyelonephritis.  POSTOPERATIVE DIAGNOSIS: Chronic right xanthogranulomatous pyelonephritis.  PROCEDURES PERFORMED: Open right nephrectomy (complex/unusual procedure due  to altered anatomy from infection/inflammation > 100% normal time)  ANESTHESIA: General.  IV MEDICATIONS: Meropenem 1 IV was given as per home medication.  ALLERGIES: Toradol, Latex  COMPLICATIONS: None.  DRAINS: NG tube, A 16-French Foley with balloon filled with 10 mL sterile  water and Jackson-Pratt drain No. 1 placed in right renal bed and  Jackson-Pratt drain No. 2 placed under right liver.  ESTIMATED BLOOD LOSS: 2000 mL.  INTRAVENOUS FLUIDS: 1800 mL of lactated Ringer's solution. 2 Units PRBCs.  SPECIMENS: Right kidney and renal stone.  INDICATIONS FOR PROCEDURE: See dictated op note  DESCRIPTION OF PROCEDURE: The patient was brought to the operating room and  placed on the operating table in supine position. Timeout was performed by  [name] to identify the patient and procedure. Induction of general  endotracheal anesthesia was done smoothly and successfully. Meropenem 1 g  IV were given. A 16 French two-way Foley catheter was introduced into the  bladder and balloon was inflated with 10 mL of sterile water. The patient  was subsequently repositioned in the left lateral decubitus position over a  beanbag at 45 degrees. All pressure points were padded and he was secured  to the table with 3-inch tape at three points. The patient was prepped and  draped in the usual sterile fashion.  A 20-cm right subcostal incision was made. The rectus sheath muscle was  divided. The muscle was split and the peritoneum was entered  sharply with  Metzenbaum scissors. The abdomen was entered: multiple dense adhesions were  noted. Careful inspection and palpation of the abdominal contents revealed  multiple adhesions. Those were taken down with sharp and blunt dissection.  The right kidney was encapsuled with chronically infected tissue such as  omentum. The right white line of Toldt was taken down from the hepatic  flexure down to the iliac vessels using the Covidien Soni-cision scalpel.  The mid-colon was mobilized medially to expose the kidney. The kidney was  identified and the upper pole was mobilized with blunt and sharp  dissection. The Adrenal Gland was removed with kidney. The kidney along  with infected surrounding tissue and Gerota's fascia was rotated medially  to expose the renal vessels posteriorly. Using the Covidien Soni-cision  Ultrasonic scalpel, the renal artery and renal vein were mobilized with  sharp and blunt dissection. The vessels were taken individually using the  Covidien Endo-GIA linear vascular stapler. The ureter was identified and  taken at its mid section between two large Hem- o-Lok clips. The rest of  the kidney was mobilized and sent for pathology. Bleeding was minimal.  Re-inspection of the intra-abdominal contents  along revealed no injury or  active bleeding. The procedure was complex/unusual due to the diffuse  scarring and adhesions from severe, chronic infection.  The procedure was  prolonged and took > 100% normal time.  A #10 JP drain No. 1 was placed at  the resection site, brought out at the mid abdomen and 2nd JP drain was  placed under the right liver and sutured to skin with nylon suture. The  incision was then irrigated copiously with sterile water. The deep fascia  and muscles were closed with 2 looped #1 PDS. Subcutaneous tissues were  irrigated again with warm NS. The skin was re-approximated with surgical  staples. Sterile dressings were applied over the incisions and secured with  silk tape.  Needle, sponge, and instrument counts were correct at the end of  procedure x2.  DISPOSITION: The patient tolerated the procedure well and was transferred  to the PACU in stable condition.  ASSESSMENT: Successful open right nephrectomy.  PLAN: Admit to floor for pain control and routine postoperative care.  I, [name] M.D., was present and scrubbed throughout the procedure.  I  agree with procedure findings, assessment and plan detailed in the above  note by Dr. Eudelia Bunch.  .  [name]  Electronically Signed  [name], MD 11/22/2018 01:37 A  .  [name]  Dictated byEudelia Bunch, MD  .  D:    11/17/2018  T:    11/17/2018 03:38 P  Dictation ID:  [id number]/Doc#  [id number]  .  cc:  .  [name]      Document is preliminary until electronically or manually signed by                             attending physician.

## 2018-11-18 ENCOUNTER — Ambulatory Visit: Admitting: Urology

## 2018-11-18 LAB — HX HEM-ROUTINE
HX BASO #: 0 10*3/uL (ref 0.0–0.2)
HX BASO: 0 %
HX EOSIN #: 0 10*3/uL (ref 0.0–0.5)
HX EOSIN: 0 %
HX HCT: 33.3 % (ref 32.0–45.0)
HX HGB: 11 g/dL (ref 11.0–15.0)
HX IMMATURE GRANULOCYTE#: 0.1 10*3/uL (ref 0.0–0.1)
HX IMMATURE GRANULOCYTE: 1 %
HX LYMPH #: 1.2 10*3/uL (ref 1.0–4.0)
HX LYMPH: 7 %
HX MCH: 30.4 pg (ref 26.0–34.0)
HX MCHC: 33 g/dL (ref 32.0–36.0)
HX MCV: 92 fL (ref 80.0–98.0)
HX MONO #: 1.1 10*3/uL — ABNORMAL HIGH (ref 0.2–0.8)
HX MONO: 7 %
HX MPV: 10.1 fL (ref 9.1–11.7)
HX NEUT #: 13.8 10*3/uL — ABNORMAL HIGH (ref 1.5–7.5)
HX NRBC #: 0 10*3/uL
HX NUCLEATED RBC: 0 %
HX PLT: 244 10*3/uL (ref 150–400)
HX RBC BLOOD COUNT: 3.62 M/uL — ABNORMAL LOW (ref 3.70–5.00)
HX RDW: 19.2 % — ABNORMAL HIGH (ref 11.5–14.5)
HX SEG NEUT: 85 %
HX WBC: 16.2 10*3/uL — ABNORMAL HIGH (ref 4.0–11.0)

## 2018-11-18 LAB — HX CHEM-OTHER
HX CALCIUM (CA): 8 mg/dL — ABNORMAL LOW (ref 8.5–10.5)
HX MAGNESIUM: 2.2 mg/dL (ref 1.6–2.6)
HX PHOSPHORUS: 2.9 mg/dL (ref 2.7–4.5)

## 2018-11-18 LAB — HX CHEM-PANELS
HX ANION GAP: 9 (ref 3–14)
HX BLOOD UREA NITROGEN: 24 mg/dL (ref 6–24)
HX CHLORIDE (CL): 107 meq/L (ref 98–110)
HX CO2: 23 meq/L (ref 20–30)
HX CREATININE (CR): 0.94 mg/dL (ref 0.57–1.30)
HX GFR, AFRICAN AMERICAN: 82 mL/min/{1.73_m2}
HX GFR, NON-AFRICAN AMERICAN: 71 mL/min/{1.73_m2}
HX GLUCOSE: 118 mg/dL (ref 70–139)
HX POTASSIUM (K): 5.1 meq/L (ref 3.6–5.1)
HX SODIUM (NA): 139 meq/L (ref 135–145)

## 2018-11-18 LAB — HX DIABETES: HX GLUCOSE: 118 mg/dL (ref 70–139)

## 2018-11-18 NOTE — Progress Notes (Signed)
* * *      **  Emily Ford**    ------    21 Y old Female, DOB: 1971/03/07    [address] [name] [place], [place] [address]    Home: [phone]    Provider: [name]        * * *    Telephone Encounter    ---    Answered by  Kirstie Peri Date: 11/18/2018       Time: 04:35 PM    Caller  pt    ------            Reason  call back            Message                     Hello,            pt had a surgery yesterday and she currently in [facility]#5 room 581 bed 1 [id number]. she having a lot a pain on her right kidney.                 Action Taken                     Miranda,Margarita  11/18/2018 4:41:54 PM >      BARRETT,PATRICK  11/20/2018 8:47:47 AM > Patient is in house, whoever is on call will manage her.                    * * *                ---          * * *         Patient: Emily Ford DOB: 03-24-1971 Provider: [name] 11/18/2018    ---    Note generated by eClinicalWorks EMR/PM Software ([email or web address])

## 2018-11-20 ENCOUNTER — Ambulatory Visit: Admitting: Internal Medicine

## 2018-11-20 LAB — HX HEM-ROUTINE
HX BASO #: 0 10*3/uL (ref 0.0–0.2)
HX BASO: 0 %
HX EOSIN #: 0.2 10*3/uL (ref 0.0–0.5)
HX EOSIN: 2 %
HX HCT: 28.9 % — ABNORMAL LOW (ref 32.0–45.0)
HX HGB: 9.1 g/dL — ABNORMAL LOW (ref 11.0–15.0)
HX IMMATURE GRANULOCYTE#: 0 10*3/uL (ref 0.0–0.1)
HX IMMATURE GRANULOCYTE: 0 %
HX LYMPH #: 0.9 10*3/uL — ABNORMAL LOW (ref 1.0–4.0)
HX LYMPH: 9 %
HX MCH: 30.5 pg (ref 26.0–34.0)
HX MCHC: 31.5 g/dL — ABNORMAL LOW (ref 32.0–36.0)
HX MCV: 97 fL (ref 80.0–98.0)
HX MONO #: 0.6 10*3/uL (ref 0.2–0.8)
HX MONO: 6 %
HX MPV: 9.8 fL (ref 9.1–11.7)
HX NEUT #: 7.9 10*3/uL — ABNORMAL HIGH (ref 1.5–7.5)
HX NRBC #: 0 10*3/uL
HX NUCLEATED RBC: 0 %
HX PLT: 223 10*3/uL (ref 150–400)
HX RBC BLOOD COUNT: 2.98 M/uL — ABNORMAL LOW (ref 3.70–5.00)
HX RDW: 17.3 % — ABNORMAL HIGH (ref 11.5–14.5)
HX SEG NEUT: 82 %
HX WBC: 9.6 10*3/uL (ref 4.0–11.0)

## 2018-11-20 LAB — HX CHEM-PANELS
HX ANION GAP: 9 (ref 3–14)
HX BLOOD UREA NITROGEN: 13 mg/dL (ref 6–24)
HX CHLORIDE (CL): 102 meq/L (ref 98–110)
HX CO2: 26 meq/L (ref 20–30)
HX CREATININE (CR): 0.9 mg/dL (ref 0.57–1.30)
HX GFR, AFRICAN AMERICAN: 86 mL/min/{1.73_m2}
HX GFR, NON-AFRICAN AMERICAN: 74 mL/min/{1.73_m2}
HX GLUCOSE: 116 mg/dL (ref 70–139)
HX POTASSIUM (K): 4.1 meq/L (ref 3.6–5.1)
HX SODIUM (NA): 137 meq/L (ref 135–145)

## 2018-11-20 LAB — HX COLONOSCOPY

## 2018-11-20 LAB — HX COAGULATION
HX INR PT: 1.1 (ref 0.9–1.3)
HX PROTHROMBIN TIME: 13 s (ref 9.7–14.0)

## 2018-11-20 LAB — HX SURGICAL

## 2018-11-20 LAB — HX CHEM-OTHER
HX CALCIUM (CA): 8.7 mg/dL (ref 8.5–10.5)
HX MAGNESIUM: 2 mg/dL (ref 1.6–2.6)
HX PHOSPHORUS: 2 mg/dL — ABNORMAL LOW (ref 2.7–4.5)

## 2018-11-20 LAB — HX DIABETES: HX GLUCOSE: 116 mg/dL (ref 70–139)

## 2018-11-20 NOTE — Progress Notes (Signed)
* * *      **  Emily Ford**    ------    2 Y old Female, DOB: 1970/12/26    [address] [name] [place], [place] [address]    Home: [phone]    Provider: Prudy Feeler        * * *    Telephone Encounter    ---    Answered by  Arta Bruce Date: 11/20/2018       Time: 11:00 AM    Caller  pt    ------            Reason  call back            Message                     hello,       pt states she is in high pain. she is experience horrible pain on the left side. She had a surgery and had her kidney removed. she has not been feeling comfortable. she is crying and states she wants to speak with a nurse. room 581 bed 1. phone number [phone].                  Action Taken                     [name],[name]  11/20/2018 11:03:20 AM >            STUBINSKI,ALISSA  11/20/2018 11:27:44 AM >                     * * *                ---          * * *         Patient: Emily Ford DOB: Sep 13, 1970 Provider: Prudy Feeler 11/20/2018    ---    Note generated by eClinicalWorks EMR/PM Software ([email or web address])

## 2018-11-21 LAB — HX TRANSFUSED PRODUCTS
HX RED CELL PRODUCT: TRANSFUSED
HX RED CELL PRODUCT: TRANSFUSED
HX UNIT RH: NEGATIVE
HX UNIT RH: NEGATIVE
HX UNIT STATUS: TRANSFUSED
HX UNIT STATUS: TRANSFUSED
HX UNIT VOLUME: 300
HX UNIT VOLUME: 300

## 2018-11-23 ENCOUNTER — Ambulatory Visit: Admitting: Infectious Disease

## 2018-11-23 NOTE — Progress Notes (Signed)
* * *      **  Emily Ford**    ------    38 Y old Female, DOB: 1971-05-01    [address] [name] [place], [place] [address]    Home: [phone]    Provider: Shayne Alken        * * *    Telephone Encounter    ---    Answered by  Delanna Notice Date: 11/23/2018       Time: 03:30 PM    Reason  opat verification and appt confirmation    ------            Message                     I spoke with RN lindsey and confirmed the abx and lab order (labs will be qtuesday) as well as her CT and f/u appt on 4/16.                    * * *                ---          * * *         Patient: Emily Ford DOB: August 04, 1971 Provider: Shayne Alken  11/23/2018    ---    Note generated by eClinicalWorks EMR/PM Software ([email or web address])

## 2018-11-24 LAB — HX MICRO

## 2018-11-25 ENCOUNTER — Ambulatory Visit

## 2018-11-25 ENCOUNTER — Ambulatory Visit: Admitting: Infectious Disease

## 2018-11-25 NOTE — Progress Notes (Signed)
* * *      **  Emily Ford**    ------    31 Y old Female, DOB: [birth year]    [address] [name] [place], [place] [address]    Home: [phone]    Provider: Karle Starch        * * *    Telephone Encounter    ---    Answered by  Delanna Notice Date: 11/25/2018       Time: 07:44 AM    Reason  lab review 3.31.20    ------            Message                     From: Karle Starch       Sent: Tuesday, November 24, 2018 3:10 PM      To: Delanna Notice      Subject: Re: labs Meikle            Thank you!      Reviewed.      No additional action needed at this time.      Tillie Rung                          * * *                ---          * * *         Patient: Emily Ford DOB: November 07, 1970 Provider: Karle Starch 11/25/2018    ---    Note generated by eClinicalWorks EMR/PM Software ([email or web address])

## 2018-11-26 ENCOUNTER — Ambulatory Visit: Admitting: Urology

## 2018-11-26 NOTE — Progress Notes (Signed)
* * *      Emily Ford, Emily Ford **DOB:** 03/23/1971 (48 yo F) **Acc No.** [id number] **DOS:**  11/26/2018    ---       [name]**    ------    62 Y old Female, DOB: 1971-04-09    [address] [name], [place] [address]    Home: [phone]    Provider: [name]        * * *    Telephone Encounter    ---    Answered by  [name] Date: 11/26/2018       Time: 12:08 PM    Caller  [name] from [facility]    ------            Reason  Schedule post-op            Message                     [name] from [facility] is calling to schedule post-op for pt. Pt is requesing an appt on 4/16. Best callback is  [phone]             [name]                Action Taken                     [name],[name]  11/26/2018 12:09:28 PM >      [name],[name]  11/26/2018 12:16:17 PM > Pt needs the appointment       1) JP drain removal early next week      2) Post op 4-5 weeks with [name] (may be telehealth)      Call [facility]      [name],[name]  11/26/2018 3:19:36 PM > when should I book the JP removal ?      Not 4/16/,  JP drain to be removed this week, either wed or fri am . Her Post-op f/u with luonogo should be booked in 4-6 weeks as per [name] when patient was d/c from hospital .        [name]  11/30/2018 11:52:53 AM >      [name],[name]  11/30/2018 12:08:08 PM > they want to know if the stables to come out, they can do it in the facilty. and also waiting for dr.Acacia Latorre on if jp can wait until 4/16      .      Action - reviewed message. Spoke to [name] who says its okay to remove the JP drain and the staples in the [facility] (if nurses are comfortable doing it). Called the nurse/case manager of the facility and she will relay this to the nurse of the patient and if there are questions they can call us back       ASK,MARIEPET  12/01/2018 12:29:14 PM >                     * * *                ---          * * *         Provider: [name] 11/26/2018     ---    Note generated by eClinicalWorks EMR/PM Software ([email or web address])

## 2018-11-27 ENCOUNTER — Ambulatory Visit: Admitting: Infectious Disease

## 2018-11-27 NOTE — Progress Notes (Signed)
 * * Emily Ford, Emily Ford **DOB:** Nov 09, 1970 (48 yo F) **Acc No.** [id number] **DOS:**  11/27/2018    ---       Emily Ford**    ------    48 Y old Female, DOB: [birth year]    [name], [place] [address]    Home: [phone]    Provider: Candelaria Celeste        * * *    Telephone Encounter    ---    Answered by  Claud Kelp Date: 11/27/2018       Time: 06:15 PM    Reason  OPAT meds    ------                * * *              * * *        ---      Reason for Appointment    ---     1\. OPAT meds    ---     History of Present Illness    ---     _GENERAL_ :    48 year old F w/hx of xanthogranulomatous pyelonephritis and perinephric  abscesses, psoas abscess presented to [facility] 3/24 for planned R nephrectomy,  abscess evacuation. On Meropenem. Likely has remaining abscesses in place, two  drains currently in. Plan for Meropenem and repeat CT in a few weeks to see if  abscesses resolved prior to stopping Abx. Cultures NGTD but was on Mero prior  to surgery.     Current Medications    ---    Taking    * Acetaminophen 325 MG Capsule 1 capsule as needed Orally every 4 hrs    ---    * Albuterol Sulfate (2.5 MG/3ML) 0.083% Nebulization Solution 3 ml as needed Inhalation every 8 hrs    ---    * Alprazolam 1 MG Tablet 1 tablet Orally Twice a day    ---    * Bisacodyl 10 MG Suppository 1 suppository as needed Rectal Once a day    ---    * Budesonide 0.5 MG/2ML Suspension 2 ml Inhalation Once a day    ---    * Butalbital-Acetaminophen 50-325 MG Tablet 1 tablet as needed Orally every 4 hrs    ---    * Calcium 600 (calcium carbonate)     ---    * Centrum Women - Tablet as directed Orally     ---    * Chlorhexidine Gluconate 0.12 % Solution as directed Mouth/Throat     ---    * DiphenhydrAMINE HCl 25 MG Capsule 1 capsule at bedtime as needed Orally Once a day    ---    * Ferrous Sulfate 325 (65 Fe) MG Tablet Delayed Release 1 tablet Orally Once a day    ---    * Gabapentin 300 MG Capsule 1  capsule Orally Once a day    ---    * HydrOXYzine HCl 25 MG/ML Solution 2 ml as needed Intramuscular every 6 hrs    ---    * Lactulose 20 GM/30ML Solution 15 ml Orally Once a day    ---    * Lovenox 100 MG/ML Solution as directed Subcutaneous     ---    * Meropenem 500 mg Solution Reconstituted as directed Intravenous q8h    ---    * Mirtazapine 7.5 MG Tablet 2 tablets at bedtime Orally Once  a day    ---    * Morphine Sulfate 15 MG Tablet 1 tablet as needed Orally every 4 hrs    ---    * Naloxone HCl 0.4 MG/ML Solution Cartridge as directed Injection     ---    * Nicotine Polacrilex 4 MG Gum 1 piece for 30 minute as needed Mouth/Throat 24 time(s) a day    ---    * Ondansetron 4 MG Tablet Disintegrating 1 tablet on the tongue and allow to dissolve Orally Once a day    ---    * Oxycodone HCl 5 MG Tablet 1 tablet as needed Orally every 6 hrs    ---    * Pantoprazole Sodium 40 MG Tablet Delayed Release 1 tablet Orally Once a day    ---    * Perforomist 20 MCG/2ML Nebulization Solution 2 ml Inhalation Twice a day    ---    * Polyethylene Glycol - Powder as directed     ---    * Senna Lax 8.6 MG Tablet 2 tablets at bedtime as needed Orally Once a day    ---    * Simethicone 80 MG Tablet 1 tablet after meals and at bedtime Orally Four times a day    ---    * trazadone 100 mg daily    ---    * Vitamin C 500 MG Capsule as directed Orally     ---    Unknown    * Alprazolam 0.5 MG Tablet 1 tablet Orally PRN TID    ---    * Ascorbic Acid 500 MG Tablet Chewable 1 tablet Orally Once a day    ---    * Bisac-Evac 10 MG Suppository 1 suppository as needed Rectal Once a day    ---    * Centrum - Tablet as directed Orally     ---    * Dulera 200-5 MCG/ACT Aerosol 2 puffs Inhalation Twice a day    ---    * Ferrous Sulfate 325 (65 Fe) MG Tablet 1 tablet Orally Once a day    ---    * Fioricet 50-325-40 MG Tablet 1 tablet as needed Orally every 4 hrs    ---    * Gabapentin 400 mg Capsule 2 capsule Orally Twice a day    ---    * Gas-X 80  MG Tablet Chewable 1 tablet after meals and at bedtime as needed Orally Four times a day    ---    * Meropenem 1 GM Solution Reconstituted as directed Intravenous every 8 hours    ---    * MiraLax - Packet 1 packet mixed with 8 ounces of fluid Orally Once a day    ---    * Narcan 4 MG/0.1ML Liquid Nasally     ---    * Neurontin 300 MG Capsule 1 capsule Orally Once a day    ---    * Niferex - Tablet as directed Orally     ---    * Omeprazole 20 MG Capsule Delayed Release 1 capsule Orally twice daily    ---    * Paroxetine HCl 20 MG Tablet 1 tablet in the morning Orally Once a day    ---    * Paxil 20 MG Tablet 1 tablet in the morning Orally Once a day    ---    * Percocet 5-325 MG Tablet 1 tablet as needed Orally every 8 hrs PRN    ---    *  Polyethylene Glycol 3350 - Powder     ---    * Proventil     ---    * Pulmicort 0.5 MG/2ML Suspension 2 ml Inhalation BID    ---    * Rivaroxaban 10 MG Tablet 1 tablet with food Orally Once a day    ---    * Saline Flush 0.9 % Solution as directed Intravenous     ---    * Senna 8.6 MG Tablet 2 tablets at bedtime as needed Orally Once a day    ---    * Senokot 8.6 MG Tablet 2 tablets at bedtime as needed Orally Once a day    ---    * Tamsulosin HCl 0.4 MG Capsule 1 capsule Orally Once a day    ---    * trazadone 50 mg 1 oral nightly for sleep    ---    * Tums 500 MG Tablet Chewable 1 tablet Orally Once a day    ---    * Valium 5 MG Tablet 1 tablet as needed Orally Q6H    ---    * Ventolin HFA     ---    * Xanax 0.5 MG Tablet 1 tablet Orally Twice a day    ---    * Xarelto 10 MG Tablet 2 tablet with food Orally Once a day    ---    * Zofran 4 MG Tablet as directed Orally Q6H PRN    ---          * * *         Provider: Candelaria Celeste 11/27/2018    ---    Note generated by eClinicalWorks EMR/PM Software ([email or web address])

## 2018-12-01 ENCOUNTER — Ambulatory Visit

## 2018-12-01 ENCOUNTER — Ambulatory Visit: Admitting: Urology

## 2018-12-01 NOTE — Progress Notes (Signed)
* * *      DANIKAH, BUDZIK **DOB:** 05-27-1971 ([phone] yo F) **Acc No.** [id number] **DOS:**  12/01/2018    ---       [name]**    ------    56 Y old Female, DOB: 02/21/1971    [name] [place], [place] [address]    Home: [phone]    Provider: [name]        * * *    Telephone Encounter    ---    Answered by  Shirlean Mylar Date: 12/01/2018       Time: 01:39 PM    Caller  pt case manager.[name]    ------            Reason  apt date            Message                     pt is calling to check the staus of her apt .[name].. she is requesting sometime at the end of April..             best number for pt [phone].[name]                 Action Taken                     Martin,Tiffany  12/01/2018 1:41:51 PM >      Arnolina,Jeudy  12/01/2018 2:15:07 PM > called got no answer V/M was left will try again later      [facility]  12/02/2018 9:27:48 AM > left megs for pt to call office                    * * *                ---          * * *         Provider: [name] 12/01/2018    ---    Note generated by eClinicalWorks EMR/PM Software ([email or web address])

## 2018-12-02 ENCOUNTER — Ambulatory Visit: Admitting: Internal Medicine

## 2018-12-02 NOTE — Progress Notes (Signed)
 * * Emily Ford, Vernessa **DOB:** June 26, 1971 (48 yo F) **Acc No.** [id number] **DOS:**  12/02/2018    ---       [name]**    ------    47 Y old Female, DOB: [birth year]    [name] [place], [place] [address]    Home: [phone]    Provider: Juline Patch        * * *    Telephone Encounter    ---    Answered by  Claud Kelp Date: 12/02/2018       Time: 10:30 AM    Reason  OPAT lab 4/8 review    ------            Message                     From: Allen Kell       Sent: Wednesday, December 02, 2018 10:10 AM      To: Claud Kelp      Subject: RE: labs: Buford, Gayler [id number] [birth year]                        I reviewed the attached labs for this patient.      Labs show no signs of antibiotic toxicity. CRP still elevated ~180      Continue current POC.            I am reviewing these labs as interim safety net physician oversight while patient between inpatient and outpatient care            Please save as tel enc.            Thanks      Irene Pap M. Revonda Standard MD      Director, [facility] OPAT Program      [email or web address]            From: Claud Kelp       Sent: Tuesday, December 01, 2018 2:21 PM      To: Allen Kell      Subject: labs: Amaka, Gluth [id number] Dec 06, 1970            Hello,            Labs attached on Mrs. Emily Ford. Collected 4/6            Seeing Mary next week on 4/16 with repeat CT. Confirmed with [facility] that they will send her.             (sorry btw I was unable to flip the labs around )            Claud Kelp      OPAT Program Specialist       Infectious Diseases       [facility]      Ph: [phone]      Fax: [phone]                            Action Taken                     Foss,Katie  12/02/2018 10:30:35 AM >                     * * *                ---          * * *  Provider: Juline Patch 12/02/2018    ---    Note generated by eClinicalWorks EMR/PM Software ([email or web address])

## 2018-12-10 ENCOUNTER — Ambulatory Visit: Admit: 2018-12-10 | Payer: No Typology Code available for payment source

## 2018-12-10 ENCOUNTER — Observation Stay
Admission: AD | Admit: 2018-12-10 | Discharge: 2018-12-14 | Disposition: A | Discharge status: Home / Self Care | Payer: No Typology Code available for payment source

## 2018-12-10 ENCOUNTER — Ambulatory Visit: Admitting: Infectious Disease

## 2018-12-10 ENCOUNTER — Inpatient Hospital Stay
Admit: 2018-12-10 | Disposition: A | Discharge status: Home / Self Care | Source: Ambulatory Visit | Attending: Gastroenterology | Admitting: Gastroenterology

## 2018-12-10 LAB — HX HEM-ROUTINE
HX BASO #: 0 10*3/uL (ref 0.0–0.2)
HX BASO: 0 %
HX EOSIN #: 0.4 10*3/uL (ref 0.0–0.5)
HX EOSIN: 5 %
HX HCT: 29.2 % — ABNORMAL LOW (ref 32.0–45.0)
HX HGB: 8.7 g/dL — ABNORMAL LOW (ref 11.0–15.0)
HX IMMATURE GRANULOCYTE#: 0 10*3/uL (ref 0.0–0.1)
HX IMMATURE GRANULOCYTE: 0 %
HX LYMPH #: 1.3 10*3/uL (ref 1.0–4.0)
HX LYMPH: 17 %
HX MCH: 28.5 pg (ref 26.0–34.0)
HX MCHC: 29.8 g/dL — ABNORMAL LOW (ref 32.0–36.0)
HX MCV: 95.7 fL (ref 80.0–98.0)
HX MONO #: 0.5 10*3/uL (ref 0.2–0.8)
HX MONO: 7 %
HX MPV: 9.5 fL (ref 9.1–11.7)
HX NEUT #: 5.3 10*3/uL (ref 1.5–7.5)
HX NRBC #: 0 10*3/uL
HX NUCLEATED RBC: 0 %
HX PLT: 387 10*3/uL (ref 150–400)
HX RBC BLOOD COUNT: 3.05 M/uL — ABNORMAL LOW (ref 3.70–5.00)
HX RDW: 17.5 % — ABNORMAL HIGH (ref 11.5–14.5)
HX SEG NEUT: 70 %
HX WBC: 7.6 10*3/uL (ref 4.0–11.0)

## 2018-12-10 LAB — HX IMMUNOLOGY: HX C-REACTIVE PROTEIN - CRP: 146 mg/L — ABNORMAL HIGH (ref 0.00–7.48)

## 2018-12-10 LAB — HX BF-URINALYSIS
HX KETONES: NEGATIVE mg/dL
HX NITRITE LEVEL: NEGATIVE
HX RED BLOOD CELLS: <1 /HPF
HX SPECIFIC GRAVITY: 1.016
HX U BILIRUBIN: NEGATIVE
HX U BLOOD: NEGATIVE
HX U GLUCOSE: NEGATIVE mg/dL
HX U PH: 7
HX U PROTEIN: NEGATIVE mg/dL
HX U UROBILINIG: 0.2 EU
HX WHITE BLOOD CELLS: >100 /HPF — AB

## 2018-12-10 LAB — HX CHEM-PANELS
HX ANION GAP: 11 (ref 3–14)
HX BLOOD UREA NITROGEN: 21 mg/dL (ref 6–24)
HX CHLORIDE (CL): 95 meq/L — ABNORMAL LOW (ref 98–110)
HX CO2: 33 meq/L — ABNORMAL HIGH (ref 20–30)
HX CREATININE (CR): 1.05 mg/dL (ref 0.57–1.30)
HX GFR, AFRICAN AMERICAN: 72 mL/min/{1.73_m2}
HX GFR, NON-AFRICAN AMERICAN: 62 mL/min/{1.73_m2}
HX GLUCOSE: 107 mg/dL (ref 70–139)
HX POTASSIUM (K): 4.4 meq/L (ref 3.6–5.1)
HX SODIUM (NA): 139 meq/L (ref 135–145)

## 2018-12-10 LAB — HX CHEM-LFT
HX ALANINE AMINOTRANSFERASE (ALT/SGPT): 20 IU/L (ref 0–54)
HX ALKALINE PHOSPHATASE (ALK): 129 IU/L (ref 40–130)
HX ASPARTATE AMINOTRANFERASE (AST/SGOT): 15 IU/L (ref 10–42)
HX BILIRUBIN, DIRECT: 0.1 mg/dL (ref 0.0–0.5)
HX BILIRUBIN, TOTAL: 0.3 mg/dL (ref 0.2–1.1)
HX LACTATE DEHYDROGENASE (LDH): 174 IU/L (ref 120–220)

## 2018-12-10 LAB — HX COAGULATION
HX INR PT: 1.4 — ABNORMAL HIGH (ref 0.9–1.3)
HX PROTHROMBIN TIME: 16.2 s — ABNORMAL HIGH (ref 9.7–14.0)
HX PTT: 46.8 s — ABNORMAL HIGH (ref 25.7–35.7)

## 2018-12-10 LAB — HX TRANSFUSION
HX ABO-RH INTERPRETATION (GEL): B POS
HX ANTIBODY SCREEN (GEL): NEGATIVE

## 2018-12-10 LAB — HX DIABETES: HX GLUCOSE: 107 mg/dL (ref 70–139)

## 2018-12-10 LAB — HX CHEM-OTHER
HX ALBUMIN: 3.8 g/dL (ref 3.4–4.8)
HX CALCIUM (CA): 9.8 mg/dL (ref 8.5–10.5)
HX MAGNESIUM: 2 mg/dL (ref 1.6–2.6)
HX PHOSPHORUS: 3.7 mg/dL (ref 2.7–4.5)
HX PROTEIN, TOTAL: 8.1 g/dL (ref 6.0–8.3)

## 2018-12-10 LAB — HX CHEM-METABOLIC: HX THYROID STIMULATING HORMONE (TSH): 1.58 u[IU]/mL (ref 0.35–4.94)

## 2018-12-10 NOTE — Progress Notes (Signed)
 * * Emily Ford, Devine **DOB:** September 30, 1970 (48 yo F) **Acc No.** [id number] **DOS:**  12/10/2018    ---       Emily Ford**    ------    48 Y old Female, DOB: 05-18-1971, External MRN: [id number]    Account Number: [id number]    [address], [place], [id number]    Home: [phone]    Insurance: E[employer] [facility] [place]    PCP: Emily Salvage, MD Referring: Emily Salvage, MD    Appointment Facility: Infectious Disease        * * *    12/10/2018 Progress Notes: Emily Alken, MD **CHN#:** [phone]    ------    ---       **Reason for Appointment**    ---      1\. OPAT F/U    ---      **History of Present Illness**    ---     _GENERAL_ :    This is a 48 yo woman w/ a long hx of xanthogranulomatous pyelonephritis and  perinephric abscesses, psoas abscess presented to [facility] 3/24 for planned R  nephrectomy and abscess evacuation. 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 coli and pseudomonas that have been cefepime and  zosyn resistant, but all cultures from [facility]. [name] have been carbapenems  susceptible).    When she came in for her surgery she had such a copious amount of pus washed  out that there was a concern there was not adequate source control and that  she would need prolonged antibiotics. She was discharged to [facility]  with the plan for Meropenem and repeat CT in a few weeks to see if abscesses  resolved prior to stopping Abx. Cultures grew Achromobacter that was rather  susceptible, but patient had been on meropenem prior to surgery.    -------------------------- Interval History --------------------    Patient presents today after getting a repeat CT scan that showed  "Postsurgical change from right nephrectomy with a curvilinear collection  measuring up to 3.9 cm seen in the resection bed. The collection is in close  approximation to the ascending colon and possible fistula between the  collection and the colon cannot be excluded.  Findings likely represent abscess  versus postoperative hematoma in the appropriate clinical setting.    2\. Trace pericardial effusion which is new since prior abdominal CT from  09/02/2018 with prominent pericardial lymph nodes. Findings may represent  infection or pericarditis. Recommended clinical correlation.    3\. There is a right adrenal hypodense lesion measuring 1.9 x 2.6 x 2.1 cm  with internal punctate foci of calcification, similar compared to prior study  and may be sequela of adrenal hematoma or prior granulomatous infection.    4\. Mild enhancement of the gallbladder wall without pericholecystic fluid,  please correlate with symptoms of cholecystitis."    In discussion with radiology this residual collection is not amendable to  drainage given the location and the curvilinear shape.    When seeing the patient she looks unwell. She is partial somnolent falling  asleep btw my questions. She is wearing 2 L n/c 02. She is has a significantly  protruding and distended abdomen, she has BL LE edema and bowel wall edema.  She is unable to get up off the stretcher.    She describes post operatively of being in increased pain. She had long  standing R sided flank  pain related to her kidney infection, but since  nephrectomy this pain has doubled. She is increased doses of narcotics  complaining that nothing helps. She denies any fevers but notes some chronic  night sweats.    She reports shortness of breath, worse when she moves, worse when she lays  flat. She reports increased BL edema, and a wt gain of 40 lbs. She is  miserable and asking for help.      **Current Medications**    ---    Taking    * Acetaminophen 325 MG Capsule 1 capsule as needed Orally every 4 hrs    ---    * Albuterol Sulfate (2.5 MG/3ML) 0.083% Nebulization Solution 3 ml as needed Inhalation every 8 hrs    ---    * Alprazolam 1 MG Tablet 1 tablet Orally Twice a day    ---    * Bisacodyl 10 MG Suppository 1 suppository as needed Rectal Once  a day    ---    * Budesonide 0.5 MG/2ML Suspension 2 ml Inhalation Once a day    ---    * Butalbital-Acetaminophen 50-325 MG Tablet 1 tablet as needed Orally every 4 hrs    ---    * Calcium 600 (calcium carbonate)     ---    * Centrum Women - Tablet as directed Orally     ---    * Chlorhexidine Gluconate 0.12 % Solution as directed Mouth/Throat     ---    * DiphenhydrAMINE HCl 25 MG Capsule 1 capsule at bedtime as needed Orally Once a day    ---    * Ferrous Sulfate 325 (65 Fe) MG Tablet Delayed Release 1 tablet Orally Once a day    ---    * Gabapentin 300 MG Capsule 1 capsule Orally Once a day    ---    * HydrOXYzine HCl 25 MG/ML Solution 2 ml as needed Intramuscular every 6 hrs    ---    * Lactulose 20 GM/30ML Solution 15 ml Orally Once a day    ---    * Lovenox 100 MG/ML Solution as directed Subcutaneous     ---    * Meropenem 500 mg Solution Reconstituted as directed Intravenous q8h    ---    * Mirtazapine 7.5 MG Tablet 2 tablets at bedtime Orally Once a day    ---    * Morphine Sulfate 15 MG Tablet 1 tablet as needed Orally every 4 hrs    ---    * Naloxone HCl 0.4 MG/ML Solution Cartridge as directed Injection     ---    * Nicotine Polacrilex 4 MG Gum 1 piece for 30 minute as needed Mouth/Throat 24 time(s) a day    ---    * Ondansetron 4 MG Tablet Disintegrating 1 tablet on the tongue and allow to dissolve Orally Once a day    ---    * Oxycodone HCl 5 MG Tablet 1 tablet as needed Orally every 6 hrs    ---    * Pantoprazole Sodium 40 MG Tablet Delayed Release 1 tablet Orally Once a day    ---    * Perforomist 20 MCG/2ML Nebulization Solution 2 ml Inhalation Twice a day    ---    * Polyethylene Glycol - Powder as directed     ---    * Senna Lax 8.6 MG Tablet 2 tablets at bedtime as needed Orally Once a day    ---    *  Simethicone 80 MG Tablet 1 tablet after meals and at bedtime Orally Four times a day    ---    * trazadone 100 mg daily    ---    * Vitamin C 500 MG Capsule as directed Orally     ---     **Past  Medical History**    ---      Anxiety.        ---    Depression.        ---    Arthritis.        ---    Back problem.        ---    Migraine.        ---    Eating disorder.        ---    Kidney stones.        ---    Hypokalemia.        ---    Otitis media.        ---    Acute pharyngitis.        ---    Disorder of kidney and ureter.        ---    Urinary tract infections (UTIs).        ---    Dysmenorrhea.        ---    Contact dermatitis.        ---    Shoulder joint pain.        ---    Neck pain.        ---    Backache.        ---    DSTYK.        ---    Parethesia.        ---    Edema.        ---    Dyspnea.        ---    Abdominal pain.        ---    Pyelonephritis.        ---    Right hydronephrosis.        ---    Staghorn calculus.        ---    Sepsis .        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        ---    Left MCA aneurysm.        ---    Right ophthalmic ICA aneurysm.        ---    Urinary rentention.        ---      **Surgical History**    ---      Appendectomy 1986    ---    Casearean section 1989    ---    Ectopic pregnancy surgery 2001    ---      **Family History**    ---      Mother: deceased    ---    Father: alive, diagnosed with Unspecified heart disease    ---    2 brother(s) , 2 sister(s) . 2 son(s) , 4 daughter(s) .    ---    Mother died of liver MS in 6. Diabetes paternal grandmother.    ---      **Social History**    ---    Tobacco    history: _Currently smoking Pack Year History: Started at 13 years until 1/2  pack per year_    Caffeine: 4 cups of coffee.    Alcohol    _Denies_    Illicit drugs: Never.    Lives with: 6 kids and grandchildren.  hx of opiod dependence, smoker, rare  etoh, no illicits, living at [facility].    ---      **Allergies**    ---      Toradol    ---      **Hospitalization/Major Diagnostic Procedure**    ---      Sepsis ( August-November-December- February) 2017-2018    ---    foley catheter removal/obtained piece 08/2018    ---       **Review of Systems**    ---    10 point ros was complete and negative except as noted per hpi.      **Vital Signs**    ---    Pain scale 10, Ht-in 61.50, Wt-lbs stretcher, BP 97/61, HR 91, RR 18, Temp  97.3, Oxygen sat % 96% 2L    BP recheck: 102/60.      **Physical Examination**    ---     _Infectious Disease_ :    General appears chronically ill, somewhat somnulent, but arousable. She  appears anasarcic, with BL LE edmea, abdominal wall edema, and distended  abdomen. [name]    HEENT: head atraumatic, pupils anicteric, noninjected, PERRL, EOMI, oropharynx  normal, no erythema, no thrush, no ulcerations.    Neck: supple, no lymphahadenopathy.    Lungs: clear bilaterally, but decreased in the bases, wearing 2 L n/c 02.    Back: significant R sided CVA tenderness, no foley in place.    Heart: RRR, no murmurs, no gallops, no rubs.    Abdomen: soft, but markedly distended, tender over the RUQ,no HSM, decreased  bowel sounds present.    Extremities: no clubbing, no cyanosis, 2 + pitting edema.    Neurology: grossly nonfocal.    Dermatology: no rash, no stigmata of endocarditis.    Psychology: seems depressed.    Lymph Nodes no cervical, no axillary, no inguinal .         **Assessments**    ---    1\. 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 Anasarca - R60.1    ---    5\. Edema, lower extremity - R60.0    ---     Patient is a 48 yo woman who has a pmhx of emphysematous pyelonephritis who  is on meropenem s/p nephrectomy on 11/17/18, with CT scan showing today  residual resolving organized abscess that is curvilinear but 3.6 cm at it's  longest. Patient will remain on meropenem for 3 more weeks till 12/31/18. At  which time she can stop antibiotics, have picc removed, and watch for any  signs/symptoms indicating concern for return of infection.    In  terms of her reported 40 lb wt gain, now on 2 L n/c 02, and her exam  showing anasarca, with a distended abdomen. Lab work weekly showing preserved  renal and liver function, I think she merits work up for R sided heart  failure, IV diuresis, +/- a closer look at liver function.    Will admit given during the COVID 19 pandemic there is no good way to  facilitate an expedited outpatient work up.    ---      **Preventive Medicine**    ---      Pt admitted to [facility] 8. Report given to [facility]. Pt sent to N8 via  stretcher 5:30pm -Orbie Pyo.        ---    Electronically signed by Emily Ford , MD on 12/24/2018 at 08:08 AM EDT    Sign off status: Completed        * * *        Infectious Disease    [address]    [place], [place] [address]    Tel: [phone]    Fax: [phone]              * * *          Progress Note: Emily Alken, MD 12/10/2018    ---    Note generated by eClinicalWorks EMR/PM Software ([email or web address])

## 2018-12-10 NOTE — Progress Notes (Signed)
 .  Progress Notes  .  Patient: Emily Ford  Provider: Shayne Alken    .  DOB: [birth year] Age: 48 Y Sex: Female  .  PCP: Adah Salvage MD  Date: 12/10/2018  .  --------------------------------------------------------------------------------  .  REASON FOR APPOINTMENT  .  1. OPAT F/U  .  HISTORY OF PRESENT ILLNESS  .  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  coli and pseudomonas that have been cefepime and zosyn resistant,  but all cultures from [facility]. Therma[facility] have been carbapenems  susceptible). When she came in for her surgery she had such a  copious amount of pus washed out that there was a concern there  was not adequate source control and that she would need prolonged  antibiotics. She was discharged to [facility] with the plan  for Meropenem and repeat CT in a few weeks to see if abscesses  resolved prior to stopping Abx. Cultures grew Achromobacter that  was rather susceptible, but patient had been on meropenem prior  to surgery. -------------------------- Interval History  --------------------Patient presents today after getting a repeat  CT scan that showed "Postsurgical change from right nephrectomy  with a curvilinear collection measuring up to 3.9 cm seen in the  resection bed. The collection is in close approximation to the  ascending colon and possible fistula between the collection and  the colon cannot be excluded. Findings likely represent abscess  versus postoperative hematoma in the appropriate clinical  setting. 2. Trace pericardial effusion which is new since prior  abdominal CT from 09/02/2018 with prominent pericardial lymph  nodes. Findings may represent infection or pericarditis.  Recommended clinical correlation. 3. There is a right  adrenal  hypodense lesion measuring 1.9 x 2.6 x 2.1 cm with internal  punctate foci of calcification, similar compared to prior study  and may be sequela of adrenal hematoma or prior granulomatous  infection. 4. Mild enhancement of the gallbladder wall without  pericholecystic fluid, please correlate with symptoms of  cholecystitis."In discussion with radiology this residual  collection is not amendable to drainage given the location and  the curvilinear shape. When seeing the patient she looks unwell.  She is partial somnolent falling asleep btw my questions. She is  wearing 2 L n/c 02. She is has a significantly protruding and  distended abdomen, she has BL LE edema and bowel wall edema. She  is unable to get up off the stretcher. She describes post  operatively of being in increased pain. She had long standing R  sided flank pain related to her kidney infection, but since  nephrectomy this pain has doubled. She is increased doses of  narcotics complaining that nothing helps. She denies any fevers  but notes some chronic night sweats. She reports shortness of  breath, worse when she moves, worse when she lays flat. She  reports increased BL edema, and a wt gain of 40 lbs. She is  miserable and asking for help.  .  CURRENT MEDICATIONS  .  Taking Acetaminophen 325 MG Capsule 1 capsule as needed Orally  every 4 hrs  Taking Albuterol Sulfate (2.5 MG/3ML) 0.083% Nebulization  Solution 3 ml as needed Inhalation every 8 hrs  Taking Alprazolam 1 MG Tablet 1 tablet Orally Twice a  day  Taking Bisacodyl 10 MG Suppository 1 suppository as needed Rectal  Once a day  Taking Budesonide 0.5 MG/2ML Suspension 2 ml Inhalation Once a  day  Taking Butalbital-Acetaminophen 50-325 MG Tablet 1 tablet as  needed Orally every 4 hrs  Taking Calcium 600 (calcium carbonate)  Taking Centrum Women - Tablet as directed Orally  Taking Chlorhexidine Gluconate 0.12 % Solution as directed  Mouth/Throat  Taking DiphenhydrAMINE HCl 25 MG Capsule 1  capsule at bedtime as  needed Orally Once a day  Taking Ferrous Sulfate 325 (65 Fe) MG Tablet Delayed Release 1  tablet Orally Once a day  Taking Gabapentin 300 MG Capsule 1 capsule Orally Once a day  Taking HydrOXYzine HCl 25 MG/ML Solution 2 ml as needed  Intramuscular every 6 hrs  Taking Lactulose 20 GM/30ML Solution 15 ml Orally Once a day  Taking Lovenox 100 MG/ML Solution as directed Subcutaneous  Taking Meropenem 500 mg Solution Reconstituted as directed  Intravenous q8h  Taking Mirtazapine 7.5 MG Tablet 2 tablets at bedtime Orally Once  a day  Taking Morphine Sulfate 15 MG Tablet 1 tablet as needed Orally  every 4 hrs  Taking Naloxone HCl 0.4 MG/ML Solution Cartridge as directed  Injection  Taking Nicotine Polacrilex 4 MG Gum 1 piece for 30 minute as  needed Mouth/Throat 24 time(s) a day  Taking Ondansetron 4 MG Tablet Disintegrating 1 tablet on the  tongue and allow to dissolve Orally Once a day  Taking Oxycodone HCl 5 MG Tablet 1 tablet as needed Orally every  6 hrs  Taking Pantoprazole Sodium 40 MG Tablet Delayed Release 1 tablet  Orally Once a day  Taking Perforomist 20 MCG/2ML Nebulization Solution 2 ml  Inhalation Twice a day  Taking Polyethylene Glycol - Powder as directed  Taking Senna Lax 8.6 MG Tablet 2 tablets at bedtime as needed  Orally Once a day  Taking Simethicone 80 MG Tablet 1 tablet after meals and at  bedtime Orally Four times a day  Taking trazadone 100 mg daily  Taking Vitamin C 500 MG Capsule as directed Orally  .  PAST MEDICAL HISTORY  .  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  ALLERGIES  .  Toradol  .  SURGICAL HISTORY  .  Appendectomy 1986  Casearean section 1989  Ectopic pregnancy surgery 2001  .  FAMILY HISTORY  .  Mother: deceased  Father: alive, diagnosed with Unspecified heart disease  2 brother(s) , 2 sister(s) . 2 son(s) , 4 daughter(s) .  Mother died of liver MS in 16. Diabetes paternal grandmother.  .  SOCIAL HISTORY  .  .  Tobacco  history:Currently smoking Pack Year History: Started at 13 years  until 1/2 pack per year  .  [name]  Caffeine: 4 cups of coffee.  .  .  Alcohol  Denies  .  [name]  Illicit drugs: Never.  .  .  Lives with: 6 kids and grandchildren.  .  hx of opiod dependence, smoker, rare  etoh, no illicits, living at  [employer].  .  HOSPITALIZATION/MAJOR DIAGNOSTIC PROCEDURE  .  Sepsis ( August-November-December- February) 2017-2018  foley catheter removal/obtained piece 08/2018  .  REVIEW OF SYSTEMS  .  10 point ros was complete and negative except as noted per hpi.  [name]  VITAL SIGNS  .  Pain scale 10, Ht-in 61.50, Wt-lbs stretcher, BP 97/61, HR 91, RR  18, Temp 97.3, Oxygen sat % 96% 2LBP recheck: 102/60.  [name]  PHYSICAL EXAMINATION  .  Infectious Disease:  General   appears chronically ill, somewhat somnulent, but  arousable. She appears anasarcic, with BL LE edmea, abdominal  wall edema, and distended abdomen. [name]  HEENT:  head atraumatic, pupils anicteric, noninjected, PERRL,  EOMI, oropharynx normal, no erythema, no thrush, no ulcerations.  Neck:  supple, no lymphahadenopathy.  Lungs:  clear bilaterally, but decreased in the bases, wearing 2  L n/c 02.  Back:  significant R sided CVA tenderness, no foley in place.  Heart:  RRR, no murmurs, no gallops, no rubs.  Abdomen:  soft, but markedly distended, tender over the RUQ,no  HSM, decreased bowel sounds present.  Extremities:  no clubbing, no cyanosis, 2 + pitting edema.  Neurology:  grossly nonfocal.  Dermatology:  no rash, no stigmata of  endocarditis.  Psychology:  seems depressed.  Lymph Nodes  no cervical, no axillary, no inguinal .  .  ASSESSMENTS  .  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  Anasarca - R60.1  .  Edema, lower extremity - R60.0  .  Patient is a 48 yo woman who has a pmhx of emphysematous  pyelonephritis who is on meropenem s/p nephrectomy on 11/17/18,  with CT scan showing today residual resolving organized abscess  that is curvilinear but 3.6 cm at it's longest. Patient will  remain on meropenem for 3 more weeks till 12/31/18. At which time  she can stop antibiotics, have picc removed, and watch for any  signs/symptoms indicating concern for return of infection. In  terms of her reported 40 lb wt gain, now on 2 L n/c 02, and her  exam showing anasarca, with a distended abdomen. Lab work weekly  showing preserved renal and liver function, I think she merits  work up for R sided heart failure, IV diuresis, +/- a closer look  at liver function. Will admit given during the COVID 19 pandemic  there is no good way to facilitate an expedited outpatient work  up.  [name]  PREVENTIVE MEDICINE  .  Pt admitted to [place] [facility]. Report given to [facility]. Pt sent to N8  via stretcher 5:30pm -Orbie Pyo.  .  Electronically signed by Shayne Alken , MD on  12/24/2018 at 08:08 AM EDT  .  Document electronically signed by Shayne Alken    .

## 2018-12-11 LAB — HX CHEM-PANELS
HX ANION GAP: 7 (ref 3–14)
HX BLOOD UREA NITROGEN: 21 mg/dL (ref 6–24)
HX CHLORIDE (CL): 97 meq/L — ABNORMAL LOW (ref 98–110)
HX CO2: 37 meq/L — ABNORMAL HIGH (ref 20–30)
HX CREATININE (CR): 1 mg/dL (ref 0.57–1.30)
HX GFR, AFRICAN AMERICAN: 76 mL/min/{1.73_m2}
HX GFR, NON-AFRICAN AMERICAN: 66 mL/min/{1.73_m2}
HX GLUCOSE: 82 mg/dL (ref 70–139)
HX POTASSIUM (K): 4.2 meq/L (ref 3.6–5.1)
HX SODIUM (NA): 141 meq/L (ref 135–145)

## 2018-12-11 LAB — HX HEM-ROUTINE
HX BASO #: 0 10*3/uL (ref 0.0–0.2)
HX BASO: 0 %
HX EOSIN #: 0.4 10*3/uL (ref 0.0–0.5)
HX EOSIN: 6 %
HX HCT: 25.4 % — ABNORMAL LOW (ref 32.0–45.0)
HX HGB: 7.8 g/dL — ABNORMAL LOW (ref 11.0–15.0)
HX IMMATURE GRANULOCYTE#: 0 10*3/uL (ref 0.0–0.1)
HX IMMATURE GRANULOCYTE: 0 %
HX LYMPH #: 1.3 10*3/uL (ref 1.0–4.0)
HX LYMPH: 19 %
HX MCH: 28.4 pg (ref 26.0–34.0)
HX MCHC: 30.7 g/dL — ABNORMAL LOW (ref 32.0–36.0)
HX MCV: 92.4 fL (ref 80.0–98.0)
HX MONO #: 0.7 10*3/uL (ref 0.2–0.8)
HX MONO: 10 %
HX MPV: 9.2 fL (ref 9.1–11.7)
HX NEUT #: 4.6 10*3/uL (ref 1.5–7.5)
HX NRBC #: 0 10*3/uL
HX NUCLEATED RBC: 0 %
HX PLT: 316 10*3/uL (ref 150–400)
HX RBC BLOOD COUNT: 2.75 M/uL — ABNORMAL LOW (ref 3.70–5.00)
HX RDW: 17.6 % — ABNORMAL HIGH (ref 11.5–14.5)
HX SEG NEUT: 65 %
HX WBC: 7 10*3/uL (ref 4.0–11.0)

## 2018-12-11 LAB — HX IMMUNOLOGY: HX C-REACTIVE PROTEIN - CRP: 141.97 mg/L — ABNORMAL HIGH (ref 0.00–7.48)

## 2018-12-11 LAB — HX CHEM-OTHER
HX CALCIUM (CA): 9.4 mg/dL (ref 8.5–10.5)
HX MAGNESIUM: 2 mg/dL (ref 1.6–2.6)
HX PHOSPHORUS: 3.9 mg/dL (ref 2.7–4.5)

## 2018-12-11 LAB — HX HEM-MISC: HX SED RATE: 49 mm — ABNORMAL HIGH (ref 0–20)

## 2018-12-11 LAB — HX CHEM-ENZ-FRAC
HX B NATRIURETIC PEPTIDE (BNP): 72 pg/mL (ref 0–100)
HX TROPONIN I: 0.01 ng/mL (ref 0.00–0.03)

## 2018-12-11 LAB — HX DIABETES: HX GLUCOSE: 82 mg/dL (ref 70–139)

## 2018-12-12 LAB — HX HEM-ROUTINE
HX BASO #: 0 10*3/uL (ref 0.0–0.2)
HX BASO: 0 %
HX EOSIN #: 0.4 10*3/uL (ref 0.0–0.5)
HX EOSIN: 7 %
HX HCT: 25.2 % — ABNORMAL LOW (ref 32.0–45.0)
HX HGB: 7.7 g/dL — ABNORMAL LOW (ref 11.0–15.0)
HX IMMATURE GRANULOCYTE#: 0 10*3/uL (ref 0.0–0.1)
HX IMMATURE GRANULOCYTE: 0 %
HX LYMPH #: 1.2 10*3/uL (ref 1.0–4.0)
HX LYMPH: 17 %
HX MCH: 29.1 pg (ref 26.0–34.0)
HX MCHC: 30.6 g/dL — ABNORMAL LOW (ref 32.0–36.0)
HX MCV: 95.1 fL (ref 80.0–98.0)
HX MONO #: 0.7 10*3/uL (ref 0.2–0.8)
HX MONO: 11 %
HX MPV: 9.8 fL (ref 9.1–11.7)
HX NEUT #: 4.5 10*3/uL (ref 1.5–7.5)
HX NRBC #: 0 10*3/uL
HX NUCLEATED RBC: 0 %
HX PLT: 329 10*3/uL (ref 150–400)
HX RBC BLOOD COUNT: 2.65 M/uL — ABNORMAL LOW (ref 3.70–5.00)
HX RDW: 17.7 % — ABNORMAL HIGH (ref 11.5–14.5)
HX SEG NEUT: 65 %
HX WBC: 6.8 10*3/uL (ref 4.0–11.0)

## 2018-12-12 LAB — HX CHEM-OTHER
HX CALCIUM (CA): 9.1 mg/dL (ref 8.5–10.5)
HX MAGNESIUM: 2 mg/dL (ref 1.6–2.6)
HX PHOSPHORUS: 3.1 mg/dL (ref 2.7–4.5)

## 2018-12-12 LAB — HX CHEM-PANELS
HX ANION GAP: 11 (ref 3–14)
HX BLOOD UREA NITROGEN: 25 mg/dL — ABNORMAL HIGH (ref 6–24)
HX CHLORIDE (CL): 97 meq/L — ABNORMAL LOW (ref 98–110)
HX CO2: 33 meq/L — ABNORMAL HIGH (ref 20–30)
HX CREATININE (CR): 0.91 mg/dL (ref 0.57–1.30)
HX GFR, AFRICAN AMERICAN: 85 mL/min/{1.73_m2}
HX GFR, NON-AFRICAN AMERICAN: 73 mL/min/{1.73_m2}
HX GLUCOSE: 94 mg/dL (ref 70–139)
HX POTASSIUM (K): 4.3 meq/L (ref 3.6–5.1)
HX SODIUM (NA): 141 meq/L (ref 135–145)

## 2018-12-12 LAB — HX DIABETES: HX GLUCOSE: 94 mg/dL (ref 70–139)

## 2018-12-12 LAB — HX COAGULATION
HX INR PT: 1.1 (ref 0.9–1.3)
HX PROTHROMBIN TIME: 12.7 s (ref 9.7–14.0)
HX PTT: 36.4 s — ABNORMAL HIGH (ref 25.7–35.7)

## 2018-12-12 LAB — HX POINT OF CARE: HX GLUCOSE-POCT: 228 mg/dL — ABNORMAL HIGH (ref 70–139)

## 2018-12-13 LAB — HX HEM-ROUTINE
HX BASO #: 0 10*3/uL (ref 0.0–0.2)
HX BASO: 0 %
HX EOSIN #: 0.4 10*3/uL (ref 0.0–0.5)
HX EOSIN: 6 %
HX HCT: 25.7 % — ABNORMAL LOW (ref 32.0–45.0)
HX HGB: 7.8 g/dL — ABNORMAL LOW (ref 11.0–15.0)
HX IMMATURE GRANULOCYTE#: 0 10*3/uL (ref 0.0–0.1)
HX IMMATURE GRANULOCYTE: 0 %
HX LYMPH #: 1.3 10*3/uL (ref 1.0–4.0)
HX LYMPH: 20 %
HX MCH: 28.2 pg (ref 26.0–34.0)
HX MCHC: 30.4 g/dL — ABNORMAL LOW (ref 32.0–36.0)
HX MCV: 92.8 fL (ref 80.0–98.0)
HX MONO #: 0.7 10*3/uL (ref 0.2–0.8)
HX MONO: 10 %
HX MPV: 9.7 fL (ref 9.1–11.7)
HX NEUT #: 4.2 10*3/uL (ref 1.5–7.5)
HX NRBC #: 0 10*3/uL
HX NUCLEATED RBC: 0 %
HX PLT: 319 10*3/uL (ref 150–400)
HX RBC BLOOD COUNT: 2.77 M/uL — ABNORMAL LOW (ref 3.70–5.00)
HX RDW: 17.8 % — ABNORMAL HIGH (ref 11.5–14.5)
HX SEG NEUT: 63 %
HX WBC: 6.6 10*3/uL (ref 4.0–11.0)

## 2018-12-13 LAB — HX CHEM-PANELS
HX ANION GAP: 10 (ref 3–14)
HX BLOOD UREA NITROGEN: 24 mg/dL (ref 6–24)
HX CHLORIDE (CL): 99 meq/L (ref 98–110)
HX CO2: 32 meq/L — ABNORMAL HIGH (ref 20–30)
HX CREATININE (CR): 0.87 mg/dL (ref 0.57–1.30)
HX GFR, AFRICAN AMERICAN: 90 mL/min/{1.73_m2}
HX GFR, NON-AFRICAN AMERICAN: 78 mL/min/{1.73_m2}
HX GLUCOSE: 95 mg/dL (ref 70–139)
HX POTASSIUM (K): 4.5 meq/L (ref 3.6–5.1)
HX SODIUM (NA): 141 meq/L (ref 135–145)

## 2018-12-13 LAB — HX DIABETES: HX GLUCOSE: 95 mg/dL (ref 70–139)

## 2018-12-13 LAB — HX COAGULATION
HX INR PT: 1.6 — ABNORMAL HIGH (ref 0.9–1.3)
HX PROTHROMBIN TIME: 18 s — ABNORMAL HIGH (ref 9.7–14.0)
HX PTT: 45.4 s — ABNORMAL HIGH (ref 25.7–35.7)

## 2018-12-13 LAB — HX CHEM-OTHER
HX CALCIUM (CA): 9.4 mg/dL (ref 8.5–10.5)
HX MAGNESIUM: 2.1 mg/dL (ref 1.6–2.6)
HX PHOSPHORUS: 3.4 mg/dL (ref 2.7–4.5)

## 2018-12-15 ENCOUNTER — Ambulatory Visit: Admitting: Infectious Disease

## 2018-12-15 NOTE — Progress Notes (Signed)
 * * Emily Ford, [name] **DOB:** 13-Aug-1971 (48 yo F) **Acc No.** [id number] **DOS:**  12/15/2018    ---       Emily Ford**    ------    15 Y old Female, DOB: 11/01/1970    [address] [name] [place], [place] [address]    Home: [phone]    Provider: Shayne Alken        * * *    Telephone Encounter    ---    Answered by  Claud Kelp Date: 12/15/2018       Time: 11:28 AM    Reason  TOC call/OPAT meds + communication    ------            Message                     pt at [facility] ph: [phone]. Spoke with [name] re; IV abx, weekly lab orders, telemed apt. Case Manager, [name] will set up video.            Transition of Care Phone Call            1). Date of discharge (put day of week): Monday 4/20      2). Date of phone call (within 2 business days- put day of week): Tuesday 4/21      3). How are you feeling? Better, worse, unchanged       pts sleepy today      4). Questions for the doctor?      not at this time      5). Verify IV/PO antibiotics - type, duration      confirmed IV abx type, duration with pts nurse, [name]      6). Verify weekly labs schedule:         labs to be draw Qmonday      7). When will PICC line get changed?      [name] will look into this                       Action Taken                     [facility]  12/15/2018 11:32:54 AM >                     * * *              * * *        ---      Reason for Appointment    ---     1\. TOC call/OPAT meds + communication    ---     History of Present Illness    ---     _GENERAL_ :    48 year old F w/hx of xanthogranulomatous pyelonephritis and perinephric  abscesses, psoas abscess presented to [facility] 3/24 for s/p R nephrectomy, abscess  evacuation. On Meropenem. Has a small abscess still there, planning for  additional 3 weeks of meropenem with [facility] follow up. This admission was  admitted for volume overload and diuresed .     Current Medications    ---    Taking    * Acetaminophen 325 MG Capsule 1 capsule as  needed Orally every 4 hrs    ---    * Alprazolam 1 MG Tablet 1 tablet Orally Twice a day    ---    *  Bisacodyl 10 MG Suppository 1 suppository as needed Rectal Once a day    ---    * Chlorhexidine Gluconate 0.12 % Solution as directed Mouth/Throat     ---    * Dulera 200-5 MCG/ACT Aerosol 2 puffs Inhalation Twice a day    ---    * Ferrous Sulfate 325 (65 Fe) MG Tablet Delayed Release 1 tablet Orally Once a day    ---    * Furosemide 40 MG Tablet 1 tablet Orally Once a day    ---    * Gabapentin 400 mg Capsule 2 capsule Orally Twice a day    ---    * Meropenem 500 mg Solution Reconstituted as directed Intravenous q8h    ---    * Mirtazapine 7.5 MG Tablet 2 tablets at bedtime Orally Once a day    ---    * Morphine Sulfate 15 MG Tablet 1 tablet as needed Orally every 4 hrs    ---    * Omeprazole 20 MG Capsule Delayed Release 1 capsule Orally twice daily    ---    * Paroxetine HCl 20 MG Tablet 1 tablet in the morning Orally Once a day    ---    * Percocet 5-325 MG Tablet 1 tablet as needed Orally every 8 hrs PRN    ---    * Polyethylene Glycol 3350 - Powder     ---    * Rivaroxaban 10 MG Tablet 1 tablet with food Orally Once a day    ---    * Senna 8.6 MG Tablet 2 tablets at bedtime as needed Orally Once a day    ---    * trazadone 50 mg 1 oral nightly for sleep    ---    * Vitamin C 500 MG Capsule as directed Orally     ---    Unknown    * Albuterol Sulfate (2.5 MG/3ML) 0.083% Nebulization Solution 3 ml as needed Inhalation every 8 hrs    ---    * Alprazolam 0.5 MG Tablet 1 tablet Orally PRN TID    ---    * Ascorbic Acid 500 MG Tablet Chewable 1 tablet Orally Once a day    ---    * Bisac-Evac 10 MG Suppository 1 suppository as needed Rectal Once a day    ---    * Budesonide 0.5 MG/2ML Suspension 2 ml Inhalation Once a day    ---    * Butalbital-Acetaminophen 50-325 MG Tablet 1 tablet as needed Orally every 4 hrs    ---    * Calcium 600 (calcium carbonate)     ---    * Centrum - Tablet as directed Orally     ---    *  Centrum Women - Tablet as directed Orally     ---    * DiphenhydrAMINE HCl 25 MG Capsule 1 capsule at bedtime as needed Orally Once a day    ---    * Ferrous Sulfate 325 (65 Fe) MG Tablet 1 tablet Orally Once a day    ---    * Fioricet 50-325-40 MG Tablet 1 tablet as needed Orally every 4 hrs    ---    * Gabapentin 300 MG Capsule 1 capsule Orally Once a day    ---    * Gas-X 80 MG Tablet Chewable 1 tablet after meals and at bedtime as needed Orally Four times a day    ---    * HydrOXYzine  HCl 25 MG/ML Solution 2 ml as needed Intramuscular every 6 hrs    ---    * Lactulose 20 GM/30ML Solution 15 ml Orally Once a day    ---    * Lovenox 100 MG/ML Solution as directed Subcutaneous     ---    * Meropenem 1 GM Solution Reconstituted as directed Intravenous every 8 hours    ---    * MiraLax - Packet 1 packet mixed with 8 ounces of fluid Orally Once a day    ---    * Naloxone HCl 0.4 MG/ML Solution Cartridge as directed Injection     ---    * Narcan 4 MG/0.1ML Liquid Nasally     ---    * Neurontin 300 MG Capsule 1 capsule Orally Once a day    ---    * Nicotine Polacrilex 4 MG Gum 1 piece for 30 minute as needed Mouth/Throat 24 time(s) a day    ---    * Niferex - Tablet as directed Orally     ---    * Ondansetron 4 MG Tablet Disintegrating 1 tablet on the tongue and allow to dissolve Orally Once a day    ---    * Oxycodone HCl 5 MG Tablet 1 tablet as needed Orally every 6 hrs    ---    * Pantoprazole Sodium 40 MG Tablet Delayed Release 1 tablet Orally Once a day    ---    * Paxil 20 MG Tablet 1 tablet in the morning Orally Once a day    ---    * Perforomist 20 MCG/2ML Nebulization Solution 2 ml Inhalation Twice a day    ---    * Polyethylene Glycol - Powder as directed     ---    * Proventil     ---    * Pulmicort 0.5 MG/2ML Suspension 2 ml Inhalation BID    ---    * Saline Flush 0.9 % Solution as directed Intravenous     ---    * Senna Lax 8.6 MG Tablet 2 tablets at bedtime as needed Orally Once a day    ---    * Senokot  8.6 MG Tablet 2 tablets at bedtime as needed Orally Once a day    ---    * Simethicone 80 MG Tablet 1 tablet after meals and at bedtime Orally Four times a day    ---    * Tamsulosin HCl 0.4 MG Capsule 1 capsule Orally Once a day    ---    * trazadone 100 mg daily    ---    * Tums 500 MG Tablet Chewable 1 tablet Orally Once a day    ---    * Valium 5 MG Tablet 1 tablet as needed Orally Q6H    ---    * Ventolin HFA     ---    * Xanax 0.5 MG Tablet 1 tablet Orally Twice a day    ---    * Xarelto 10 MG Tablet 2 tablet with food Orally Once a day    ---    * Zofran 4 MG Tablet as directed Orally Q6H PRN    ---          * * *         Provider: Shayne Alken 12/15/2018    ---    Note generated by eClinicalWorks EMR/PM Software ([email or web address])

## 2018-12-16 LAB — HX MICRO

## 2018-12-22 ENCOUNTER — Ambulatory Visit: Admit: 2018-12-22 | Payer: No Typology Code available for payment source

## 2018-12-22 ENCOUNTER — Ambulatory Visit: Admitting: Infectious Disease

## 2018-12-22 NOTE — Progress Notes (Signed)
 * * Emily Ford, Emily Ford **DOB:** 01-17-1971 (48 yo F) **Acc No.** [id number] **DOS:**  12/22/2018    ---       Emily Ford**    ------    48 Y old Female, DOB: 09/29/1970, External MRN: [id number]    Account Number: [id number]    [address], [place], [place]    Home: [phone]    Insurance: E59 [employer] [facility] [place]    PCP: Emily Salvage, MD Referring: Emily Salvage, MD    Appointment Facility: Infectious Disease        * * *    12/22/2018 Progress Notes: Emily Alken, MD **CHN#:** [id number]    ------    ---       **Reason for Appointment**    ---      1\. OPAT FU    ---      **History of Present Illness**    ---     _GENERAL_ :    The patient encounter today occurred via telehealth (telemedicine) with the  patient's verbal consent.    The reason for the telehealth visit was due to the COVID-19 pandemic crisis /  federally declared state of public health emergency, and need for social  distancing. The patient denies having any known contacts with people diagnosed  with COVID-19 or any signs of active infection with COVID-19. The patient is  reasonably trying to maintain social distancing measures.    Method of Telehealth used was a real-time, interactive, secure and private  audio only given she is in a rehab center w/o access to email. Physical  location of the patient was at [facility]. Physical location of the  provider was my private home residence. Only the patient, briefly her rehab  nurse, and I were on the call.    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 She has a long MDR organisms from her  urine (usually ESBL E. coli and pseudomonas that is cefepime and zosyn  resistant, but all cultures from [facility]. [facility] have been carbapenems  susceptible).    When she came in for her surgery she had copious pus washed  out but there was  a concern given the frank volume that she would need prolonged antibiotics.    She had follow up in person on 4/16 and on repeat CT there was some  curvilinear collection measuring up to 3.9 cm seen in the resection bed. that  persisted. This collection was in close approximation to the ascending colon  and possible fistula between the collection and the colon cannot be excluded.    Patient was also found to be on 2 L n/c 02, with more abdominal distension  than prior, and anasarca. She was admitted from clinic for work up to make  sure there wasn't missed heart failure. Her renal function and liver function  were fine. Patient was diuresed, no clear cause other than third spacing  postoperatively was noted.    Patient discharged back to [place] a few days later and has been working with  PT/OT to regain her strength as she hopes to go home soon. Plan was for  antibiotics (meropenem) to continue till 12/31/18.    She continues to report a lot of R sided abdominal and flank pain, but no  fevers, chills, night sweats. She is constipated  but still stooling. Good  appetite. Edema has improved.      **Current Medications**    ---    Taking    * Acetaminophen 325 MG Capsule 1 capsule as needed Orally every 4 hrs    ---    * Alprazolam 1 MG Tablet 1 tablet Orally Twice a day    ---    * Bisacodyl 10 MG Suppository 1 suppository as needed Rectal Once a day    ---    * Chlorhexidine Gluconate 0.12 % Solution as directed Mouth/Throat     ---    * Dulera 200-5 MCG/ACT Aerosol 2 puffs Inhalation Twice a day    ---    * Ferrous Sulfate 325 (65 Fe) MG Tablet Delayed Release 1 tablet Orally Once a day    ---    * Furosemide 40 MG Tablet 1 tablet Orally Once a day    ---    * Gabapentin 400 mg Capsule 2 capsule Orally Twice a day    ---    * Meropenem 500 mg Solution Reconstituted as directed Intravenous q8h    ---    * Mirtazapine 7.5 MG Tablet 2 tablets at bedtime Orally Once a day    ---    * Morphine Sulfate 15  MG Tablet 1 tablet as needed Orally every 4 hrs    ---    * Omeprazole 20 MG Capsule Delayed Release 1 capsule Orally twice daily    ---    * Paroxetine HCl 20 MG Tablet 1 tablet in the morning Orally Once a day    ---    * Percocet 5-325 MG Tablet 1 tablet as needed Orally every 8 hrs PRN    ---    * Polyethylene Glycol 3350 - Powder     ---    * Rivaroxaban 10 MG Tablet 1 tablet with food Orally Once a day    ---    * Senna 8.6 MG Tablet 2 tablets at bedtime as needed Orally Once a day    ---    * trazadone 50 mg 1 oral nightly for sleep    ---    * Vitamin C 500 MG Capsule as directed Orally     ---    * Medication List reviewed and reconciled with the patient    ---      **Past Medical History**    ---      Anxiety.        ---    Depression.        ---    Arthritis.        ---    Back problem.        ---    Migraine.        ---    Eating disorder.        ---    Kidney stones.        ---    Hypokalemia.        ---    Otitis media.        ---    Acute pharyngitis.        ---    Disorder of kidney and ureter.        ---    Urinary tract infections (UTIs).        ---    Dysmenorrhea.        ---    Contact dermatitis.        ---  Shoulder joint pain.        ---    Neck pain.        ---    Backache.        ---    DSTYK.        ---    Parethesia.        ---    Edema.        ---    Dyspnea.        ---    Abdominal pain.        ---    Pyelonephritis.        ---    Right hydronephrosis.        ---    Staghorn calculus.        ---    Sepsis .        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        ---    Left MCA aneurysm.        ---    Right ophthalmic ICA aneurysm.        ---    Urinary rentention.        ---      **Surgical History**    ---      Appendectomy 1986    ---    Casearean section 1989    ---    Ectopic pregnancy surgery 2001    ---      **Family History**    ---      Mother: deceased    ---    Father: alive, diagnosed with Unspecified heart disease    ---    2 brother(s) , 2 sister(s) . 2  son(s) , 4 daughter(s) .    ---    Mother died of liver MS in 43. Diabetes paternal grandmother.    ---      **Social History**    ---    Tobacco    history: _Currently smoking Pack Year History: Started at 13 years until 1/2  pack per year_    Caffeine: 4 cups of coffee.    Alcohol    _Denies_    Illicit drugs: Never.    Lives with: 6 kids and grandchildren.  hx of opiod dependence, smoker, rare  etoh, no illicits, living at [facility].    ---      **Allergies**    ---      Toradol    ---      **Hospitalization/Major Diagnostic Procedure**    ---      Sepsis ( August-November-December- February) 2017-2018    ---    foley catheter removal/obtained piece 08/2018    ---      **Review of Systems**    ---    this was a tele med visit so exam was limitted.      **Assessments**    ---    1\. 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 Anasarca - R60.1    ---    5\. Edema, lower extremity - R60.0    ---     Patient is a 48 yo woman who has a pmhx of emphysematous pyelonephritis who  is on meropenem s/p nephrectomy on 11/17/18,  with CT scan showing today  residual resolving organized abscess that is curvilinear but 3.9 cm at it's  longest. Patient will remain on meropenem until 12/31/18. At which time she can  stop antibiotics, have picc removed, and watch for any signs/symptoms  indicating concern for return of infection.    She will need to continue to work hard on her PT/OT to regain mobilization,  she will need to be maintained on a diuretic. She will need to stop smoking.  And continue a good diet for her overall health.    >30 min was spent in counseling and coordination of care. All patient's  questions were answered.    ---      **Procedure Codes**    ---      [phone] PHONE E/M BY PHYS 21-30 MIN    ---    Electronically signed by Emily Ford , MD on 12/24/2018 at 09:54 AM EDT    Sign off status: Completed        * * *        Infectious Disease    [address] [place]    [place], [place] [address]    Tel: [phone]    Fax: [phone]              * * *          Progress Note: Emily Alken, MD 12/22/2018    ---    Note generated by eClinicalWorks EMR/PM Software ([email or web address])

## 2018-12-22 NOTE — Progress Notes (Signed)
 .  Progress Notes  .  Patient: Emily Ford  Provider: Shayne Alken    .  DOB: 12/28/1970 Age: 48 Y Sex: Female  .  PCP: Adah Salvage MD  Date: 12/22/2018  .  --------------------------------------------------------------------------------  .  REASON FOR APPOINTMENT  .  1. OPAT FU  .  HISTORY OF PRESENT ILLNESS  .  GENERAL:   The patient encounter today occurred via telehealth  (telemedicine) with the patient's verbal consent.The reason for  the telehealth visit was due to the COVID-19 pandemic crisis /  federally declared state of public health emergency, and need for  social distancing. The patient denies having any known contacts  with people diagnosed with COVID-19 or any signs of active  infection with COVID-19. The patient is reasonably trying to  maintain social distancing measures.Method of Telehealth used was  a real-time, interactive, secure and private audio only given she  is in a rehab center w/o access to email. Physical location of  the patient was at [facility]. Physical location of the  provider was my private home residence. Only the patient, briefly  her rehab nurse, and I were on the call. This is a 48 yo woman w/  a long hx of xanthogranulomatous pyelonephritis and perinephric  abscesses, psoas abscess presented to [facility] 3/24 for planned R  nephrectomy and abscess evacuation. She has a long MDR organisms  from her urine (usually ESBL E. coli and pseudomonas that is  cefepime and zosyn resistant, but all cultures from [facility] have been carbapenems susceptible). When she came in  for her surgery she had copious pus washed out but there was a  concern given the frank volume that she would need prolonged  antibiotics. She had follow up in person on 4/16 and on repeat CT  there was some curvilinear collection measuring up to 3.9 cm seen  in the resection bed. that persisted. This collection was in  close approximation to the ascending colon and possible fistula  between  the collection and the colon cannot be excluded. Patient  was also found to be on 2 L n/c 02, with more abdominal  distension than prior, and anasarca. She was admitted from clinic  for work up to make sure there wasn't missed heart failure. Her  renal function and liver function were fine. Patient was  diuresed, no clear cause other than third spacing postoperatively  was noted. Patient discharged back to [place] a few days later and  has been working with PT/OT to regain her strength as she hopes  to go home soon. Plan was for antibiotics (meropenem) to continue  till 12/31/18.She continues to report a lot of R sided abdominal  and flank pain, but no fevers, chills, night sweats. She is  constipated but still stooling. Good appetite. Edema has  improved.  .  CURRENT MEDICATIONS  .  Taking Acetaminophen 325 MG Capsule 1 capsule as needed Orally  every 4 hrs  Taking Alprazolam 1 MG Tablet 1 tablet Orally Twice a day  Taking Bisacodyl 10 MG Suppository 1 suppository as needed Rectal  Once a day  Taking Chlorhexidine Gluconate 0.12 % Solution as directed  Mouth/Throat  Taking Dulera 200-5 MCG/ACT Aerosol 2 puffs Inhalation Twice a  day  Taking Ferrous Sulfate 325 (65 Fe) MG Tablet Delayed Release 1  tablet Orally Once a day  Taking Furosemide 40 MG Tablet 1 tablet Orally Once a day  Taking Gabapentin 400 mg Capsule 2 capsule Orally Twice a  day  Taking Meropenem 500 mg Solution Reconstituted as directed  Intravenous q8h  Taking Mirtazapine 7.5 MG Tablet 2 tablets at bedtime Orally Once  a day  Taking Morphine Sulfate 15 MG Tablet 1 tablet as needed Orally  every 4 hrs  Taking Omeprazole 20 MG Capsule Delayed Release 1 capsule Orally  twice daily  Taking Paroxetine HCl 20 MG Tablet 1 tablet in the morning Orally  Once a day  Taking Percocet 5-325 MG Tablet 1 tablet as needed Orally every 8  hrs PRN  Taking Polyethylene Glycol 3350 - Powder  Taking Rivaroxaban 10 MG Tablet 1 tablet with food Orally Once a  day  Taking Senna  8.6 MG Tablet 2 tablets at bedtime as needed Orally  Once a day  Taking trazadone 50 mg 1 oral nightly for sleep  Taking Vitamin C 500 MG Capsule as directed Orally  Medication List reviewed and reconciled with the patient  .  PAST MEDICAL HISTORY  .  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  ALLERGIES  .  Toradol  .  SURGICAL HISTORY  .  Appendectomy 1986  Casearean section 1989  Ectopic pregnancy surgery 2001  .  FAMILY HISTORY  .  Mother: deceased  Father: alive, diagnosed with Unspecified heart disease  2 brother(s) , 2 sister(s) . 2 son(s) , 4 daughter(s) .  Mother died of liver MS in 65. Diabetes paternal grandmother.  .  SOCIAL HISTORY  .  .  Tobacco  history:Currently smoking Pack Year History: Started at 13 years  until 1/2 pack per year  .  [name]  Caffeine: 4 cups of coffee.  .  .  Alcohol  Denies  .  [name]  Illicit drugs: Never.  .  .  Lives with: 6 kids and grandchildren.  .  hx of opiod dependence, smoker, rare etoh, no illicits, living at  [employer].  .  HOSPITALIZATION/MAJOR DIAGNOSTIC PROCEDURE  .  Sepsis ( August-November-December- February) 2017-2018  foley catheter removal/obtained piece 08/2018  .  REVIEW OF SYSTEMS  .  this was a tele med visit so exam was limitted.  .  ASSESSMENTS  .  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  Anasarca - R60.1  .  Edema, lower extremity - R60.0  .  Patient is a 48 yo  woman who has a pmhx of emphysematous  pyelonephritis who is on meropenem s/p nephrectomy on 11/17/18,  with CT scan showing today residual resolving organized abscess  that is curvilinear but 3.9 cm at it's longest. Patient will  remain on meropenem until 12/31/18. At which time she can stop  antibiotics, have picc removed, and watch for any signs/symptoms  indicating concern for return of infection. She will need to  continue to work hard on her PT/OT to regain mobilization, she  will need to be maintained on  a diuretic. She will need to stop  smoking. And continue a good diet for her overall health.>30 min  was spent in counseling and coordination of care. All patient's  questions were answered.  [name]  PROCEDURE CODES  .  [id number] PHONE E/M BY PHYS 21-30 MIN  .  Electronically signed by Shayne Alken , MD on  12/24/2018 at 09:54 AM EDT  .  Document electronically signed by Shayne Alken    .

## 2018-12-23 ENCOUNTER — Ambulatory Visit: Admitting: Internal Medicine

## 2018-12-23 NOTE — Progress Notes (Signed)
* * *      SAFARI, CINQUE **DOB:** 09/08/1970 (48 yo F) **Acc No.** [id number] **DOS:**  12/23/2018    ---       [name]**    ------    14 Y old Female, DOB: [birth year]    [address] [name] [place], [place] [address]    Home: [phone]    Provider: Juline Patch        * * *    Telephone Encounter    ---    Answered by  Claud Kelp Date: 12/23/2018       Time: 03:16 PM    Reason  OPAT lab call    ------            Message                     called and spoke with Al, RN. He will fax over labs to OPAT,                 Action Taken                     [facility]  12/23/2018 3:17:21 PM >                    * * *                ---          * * *         Provider: Juline Patch 12/23/2018    ---    Note generated by eClinicalWorks EMR/PM Software ([email or web address])

## 2018-12-24 ENCOUNTER — Ambulatory Visit: Admitting: Infectious Disease

## 2018-12-24 ENCOUNTER — Ambulatory Visit

## 2018-12-24 NOTE — Progress Notes (Signed)
* * *      Emily Ford, Emily Ford **DOB:** 1971-05-08 (48 yo F) **Acc No.** [id number] **DOS:**  12/24/2018    ---       Emily Ford**    ------    48 Y old Female, DOB: March 20, 1971    [address] [name] [place], [place] [address]    Home: [phone]    Provider: Shayne Alken        * * *    Telephone Encounter    ---    Answered by  Shayne Alken Date: 12/24/2018       Time: 08:08 AM    Reason  EOT OPAT    ------            Message                     will you please make sure that [facility] has a copy of my last note and orders to stop meropenem and remove the picc line 12/31/18.  Thank you.                  Action Taken                     [facility]  12/24/2018 9:07:13 AM >      Foss,Katie  12/24/2018 9:32:43 AM >      Foss,Katie  12/24/2018 10:25:58 AM > spoke with Nehemiah Settle, RN and communicated EOT orders. Will fax to [facility] at [phone]. I also asked that labs be faxed over.      Foss,Katie  12/24/2018 11:23:35 AM > labs attached.                    * * *                ---          * * *         Provider: Shayne Alken 12/24/2018    ---    Note generated by eClinicalWorks EMR/PM Software ([email or web address])

## 2018-12-31 ENCOUNTER — Ambulatory Visit: Admitting: Infectious Disease

## 2018-12-31 NOTE — Progress Notes (Signed)
* * *      Emily Ford, Emily Ford **DOB:** 1971-02-25 (48 yo F) **Acc No.** [id number] **DOS:**  12/31/2018    ---       Emily Ford**    ------    32 Y old Female, DOB: [birth year]    [address] [name] [place], [place] [address]    Home: [phone]    Provider: Shayne Alken        * * *    Telephone Encounter    ---    Answered by  Claud Kelp Date: 12/31/2018       Time: 03:25 PM    Reason  OPAT lab calls    ------            Message                     [facility] ph: [phone]      Called and spoke with Diane. Labs werent drawn Monday. I asked for them to be drawn tomorrow                 Action Taken                     [facility]  12/31/2018 3:25:54 PM >                     * * *                ---          * * *         Provider: Shayne Alken 12/31/2018    ---    Note generated by eClinicalWorks EMR/PM Software ([email or web address])

## 2019-01-01 ENCOUNTER — Ambulatory Visit: Admitting: Infectious Disease

## 2019-01-01 NOTE — Progress Notes (Signed)
* * *      TONDA, WIEDERHOLD **DOB:** 12/11/1970 (48 yo F) **Acc No.** [id number] **DOS:**  01/01/2019    ---       [name]**    ------    34 Y old Female, DOB: [birth year]    [address] [name] [place], [place] [address]    Home: [phone]    Provider: Shayne Alken        * * *    Telephone Encounter    ---    Answered by  Claud Kelp Date: 01/01/2019       Time: 09:27 AM    Reason  OPAT lab review    ------            Message                           From: Carolynn Serve       Sent: Friday, Jan 01, 2019 8:58 AM      To: Claud Kelp      Subject: Re: labs: Paulyne, Mooty [id number] 1970-10-08            Thank you. Looks good      Sent from [employer]                  On Jan 01, 2019, at 8:39 AM, Raliegh Ip, Katie [email or web address]> wrote:             Hello,              Labs attached from 5/4             Thank you             Claud Kelp      Lead OPAT Program Specialist       Administrator of COVID clinic and Infectious Diseases Clinic      Infectious Diseases       [facility]      Ph: [phone]      Fax: [phone]             [email or web address]                      Action Taken                     [facility]  01/01/2019 9:27:18 AM >                     * * *                ---          * * *         Provider: Shayne Alken 01/01/2019    ---    Note generated by eClinicalWorks EMR/PM Software ([email or web address])

## 2019-01-06 ENCOUNTER — Ambulatory Visit: Admitting: Infectious Disease

## 2019-01-06 NOTE — Progress Notes (Signed)
* * *      Emily Ford, Emily Ford **DOB:** [birth year] (48 yo F) **Acc No.** [id number] **DOS:**  01/06/2019    ---       Emily Ford**    ------    59 Y old Female, DOB: 07-29-1971    [address] [name] [place], [place] [address]    Home: [phone]    Provider: Shayne Alken        * * *    Telephone Encounter    ---    Answered by  Claud Kelp Date: 01/06/2019       Time: 04:52 PM    Reason  OPAT lab calls    ------            Message                     called Emily Ford and spoke with Emily Ford. Asked if she had labs, and was told that she was DC' home. Emily Ford wil have RN call me.             Emily Squibb, RN called me and I was told that patient left and went home today as she completed her IV abx.                       Action Taken                     [facility]  01/06/2019 4:54:24 PM > checking with MD about EOT plan.                    * * *                ---          * * *         Provider: Shayne Alken 01/06/2019    ---    Note generated by eClinicalWorks EMR/PM Software ([email or web address])

## 2019-02-01 ENCOUNTER — Ambulatory Visit: Admitting: Urology

## 2019-02-01 NOTE — Progress Notes (Signed)
* * *      FLOREE, ZUNIGA **DOB:** 1971/07/12 (48 yo F) **Acc No.** [id number] **DOS:**  02/01/2019    ---       [name]**    ------    68 Y old Female, DOB: [birth year]    [address] [name] [place], [place] [address]    Home: [phone]    Provider: [name]        * * *    Telephone Encounter    ---    Answered by  [name] Date: 02/01/2019       Time: 03:22 PM    Caller  Pt    ------            Reason  Staple removal / post-op            Message                     Pt explains she had a procedure on 3/24 and she still has staples in. Pt needs an appt to have her staples removed. Pt never had a post-op appt. Best callback is [phone]                Action Taken                     [name],[name]  02/01/2019 3:23:50 PM >      [name],[name]  02/01/2019 4:10:33 PM > spoke to pt                    * * *                ---          * * *         Provider: [name] 02/01/2019    ---    Note generated by eClinicalWorks EMR/PM Software ([email or web address])

## 2019-02-04 ENCOUNTER — Ambulatory Visit: Admit: 2019-02-04 | Payer: No Typology Code available for payment source

## 2019-02-04 ENCOUNTER — Ambulatory Visit: Admitting: Urology

## 2019-02-04 ENCOUNTER — Ambulatory Visit (HOSPITAL_BASED_OUTPATIENT_CLINIC_OR_DEPARTMENT_OTHER): Admitting: Psychiatry

## 2019-02-04 NOTE — Progress Notes (Signed)
 .  Progress Notes  .  Patient: Emily Ford  Provider: [name]    .  DOB: 15-Jun-1971 Age: 48 Y Sex: Female  .  PCP: Adah Salvage MD  Date: 02/04/2019  .  --------------------------------------------------------------------------------  .  REASON FOR APPOINTMENT  .  1. History of Xanthogranulomatous Right Pyelonephritis  .  2. s/p Open right nephrectomy 11/17/2018 for removal fo staples  .  HISTORY OF PRESENT ILLNESS  .  Adult Urology:   Emily Ford is a 48 year old female with a complex medical historu  including but not limited to right hydronephrosis, recurrent  pyelonephritis, staghorn calculus s/p open right nephrectomy  11/17/2018 3 months postop who returns for removal of staples.  .Patient is doing well. No fever or shills, No recurrence of  pyelonephritis. .  .  Risk Assessment:  Is Patient a Fall Risk?  .  :Yes, fall risk procedures implemented per hospital policy  .  [name]  Ambulatory Falls and Injury Prevention:  HPI  .  Have you experienced a fall in the past year?No , Is the patient  using assistive devices such as a cane or walker?No , Do you need  assistance with ambulation while at our facility?No  .  .  CURRENT MEDICATIONS  .  Taking Acetaminophen 325 MG Capsule 1 capsule as needed Orally  every 4 hrs  Taking Alprazolam 1 MG Tablet 1 tablet Orally Twice a day  Taking Bisacodyl 10 MG Suppository 1 suppository as needed Rectal  Once a day  Taking Chlorhexidine Gluconate 0.12 % Solution as directed  Mouth/Throat  Taking Dulera 200-5 MCG/ACT Aerosol 2 puffs Inhalation Twice a  day  Taking Ferrous Sulfate 325 (65 Fe) MG Tablet Delayed Release 1  tablet Orally Once a day  Taking Furosemide 40 MG Tablet 1 tablet Orally Once a day  Taking Gabapentin 400 mg Capsule 2 capsule Orally Twice a day  Taking Meropenem 500 mg Solution Reconstituted as directed  Intravenous q8h  Taking Mirtazapine 7.5 MG Tablet 2 tablets at bedtime Orally Once  a day  Taking Morphine Sulfate 15 MG Tablet 1 tablet as  needed Orally  every 4 hrs  Taking Omeprazole 20 MG Capsule Delayed Release 1 capsule Orally  twice daily  Taking Paroxetine HCl 20 MG Tablet 1 tablet in the morning Orally  Once a day  Taking Percocet 5-325 MG Tablet 1 tablet as needed Orally every 8  hrs PRN  Taking Polyethylene Glycol 3350 - Powder  Taking Rivaroxaban 10 MG Tablet 1 tablet with food Orally Once a  day  Taking Senna 8.6 MG Tablet 2 tablets at bedtime as needed Orally  Once a day  Taking trazadone 50 mg 1 oral nightly for sleep  Taking Vitamin C 500 MG Capsule as directed Orally  Medication List reviewed and reconciled with the patient  .  PAST MEDICAL HISTORY  .  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  ALLERGIES  .  Toradol  .  SURGICAL HISTORY  .  Appendectomy 1986  Casearean section 1989  Ectopic pregnancy surgery 2001  .  FAMILY HISTORY  .  Mother: deceased  Father: alive, diagnosed with Unspecified heart disease  2 brother(s) , 2 sister(s) . 2 son(s) , 4 daughter(s) .  Mother died of liver Emily in 19. Diabetes paternal grandmother.  .  SOCIAL HISTORY  .  .  Tobaccohistory:Currently smoking Pack Year History: Started at 13  years until 1/2 pack per year .  [name]  Caffeine: 4 cups of coffee.  .  Alcohol Denies.  .  Illicit drugs: Never.  .  Lives with: 6 kids and grandchildren.  .  hx of opiod dependence, smoker, rare etoh, no illicits, living at  [employer].  .  HOSPITALIZATION/MAJOR DIAGNOSTIC PROCEDURE  .  Sepsis ( August-November-December- February) 2017-2018  foley catheter  removal/obtained piece 08/2018  .  REVIEW OF SYSTEMS  .  No urgent complaints. Complaining of right back pain baseline,  Denies gross hematuria, fever, chills, night sweats, dysuria,  urethral discharge, pyuria, UTI, N & V, abdominal pain, abnormal  bleeding, easy brusing, weight loss and skeletal pain.  [name]  VITAL SIGNS  .  Pain scale 8, Ht-in 61.50, Wt-lbs 196, BMI 36.43, BP 130/81, HR  89, BSA 1.96, Ht-cm 156.21, Wt-kg 88.91, Wt Change 14 lb.  .  PHYSICAL EXAMINATION  .  Urology PE:  General Appearance:  Obese, well-developed, well nourished, NAD,  normal secondary sexual characteristics on a wheel chair but can  ambulate short distances . Lungs:  normal respirations, symmetric  excursion with no accesory muscle use. Back:  vertebral column  aligned, no sacral [place] or dimples, no scoliosis/kyphosis,  masses or tenderness. Abdomen:  Right Kocher's incision well  healed, no keloid formation. Remaining staples in place with  erythema around each staple, no discharges, globular abdomen  soft, benign, nontender, distended, no palpable masses, no  hepato-splenomegaly, no hernias, bladder not palpable.  Extremities:  no clubbing, cyanosis or pitting edema.  Musculo-Skeletal:  no muscle wasting, joint swelling or  tenderness. Lymphatic:  no cervical, axillary or inguinal  adenopathy. Neurologic:  oriented x3, no focal deficits.  .  ASSESSMENTS  .  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  [name]  PROCEDURES  .  Remaining staples on the surgical incision site  removed without any difficulty. No bleeding and no discharges  noted.  .  FOLLOW UP  .  RTC prn (per [name])  .  Electronically signed by [name] , MD on  02/12/2019 at 10:15 PM EDT  .  Document electronically signed by [name]   .

## 2019-02-04 NOTE — Progress Notes (Signed)
 * * Emily Ford, [name] **DOB:** Mar 17, 1971 (48 yo F) **Acc No.** [id number] **DOS:**  02/04/2019    ---       [name]**    ------    18 Y old Female, DOB: [birth year], External MRN: [id number]    Account Number: [id number]    [address], [place], [place]    Home: [phone]    Insurance: E59 [employer] [facility]    PCP: Adah Salvage, MD Referring: Adah Salvage, MD    Appointment Facility: [facility]        * * *    02/04/2019 Progress Notes: [name], MD **CHN#:** [id number]    ------    ---       **Reason for Appointment**    ---      1\. History of Xanthogranulomatous Right Pyelonephritis    ---    2\. s/p Open right nephrectomy 11/17/2018 for removal fo staples    ---     **History of Present Illness**    ---     _Adult Urology_ :    Ms Emily Ford is a 48 year old female with a complex medical historu including but  not limited to right hydronephrosis, recurrent pyelonephritis, staghorn  calculus s/p open right nephrectomy 11/17/2018 3 months postop who returns for  removal of staples.    .    Patient is doing well. No fever or shills, No recurrence of pyelonephritis.    [name] Assessment_ :    Is Patient a Fall Risk? : Yes, fall risk procedures implemented per hospital  policy .    _Ambulatory Falls and Injury Prevention_ :    HPI Have you experienced a fall in the past year? No, Is the patient using  assistive devices such as a cane or walker? No, Do you need assistance with  ambulation while at our facility? No.     **Current Medications**    ---    Taking    * Acetaminophen 325 MG Capsule 1 capsule as needed Orally every 4 hrs    ---    * Alprazolam 1 MG Tablet 1 tablet Orally Twice a day    ---    * Bisacodyl 10 MG Suppository 1 suppository as needed Rectal Once a day    ---    * Chlorhexidine Gluconate 0.12 % Solution as directed Mouth/Throat     ---    * Dulera 200-5 MCG/ACT Aerosol 2 puffs Inhalation Twice a day    ---    * Ferrous  Sulfate 325 (65 Fe) MG Tablet Delayed Release 1 tablet Orally Once a day    ---    * Furosemide 40 MG Tablet 1 tablet Orally Once a day    ---    * Gabapentin 400 mg Capsule 2 capsule Orally Twice a day    ---    * Meropenem 500 mg Solution Reconstituted as directed Intravenous q8h    ---    * Mirtazapine 7.5 MG Tablet 2 tablets at bedtime Orally Once a day    ---    * Morphine Sulfate 15 MG Tablet 1 tablet as needed Orally every 4 hrs    ---    * Omeprazole 20 MG Capsule Delayed Release 1 capsule Orally twice daily    ---    * Paroxetine HCl 20 MG Tablet 1 tablet in the morning Orally Once a day    ---    *  Percocet 5-325 MG Tablet 1 tablet as needed Orally every 8 hrs PRN    ---    * Polyethylene Glycol 3350 - Powder     ---    * Rivaroxaban 10 MG Tablet 1 tablet with food Orally Once a day    ---    * Senna 8.6 MG Tablet 2 tablets at bedtime as needed Orally Once a day    ---    * trazadone 50 mg 1 oral nightly for sleep    ---    * Vitamin C 500 MG Capsule as directed Orally     ---    * Medication List reviewed and reconciled with the patient    ---      **Past Medical History**    ---      Anxiety.        ---    Depression.        ---    Arthritis.        ---    Back problem.        ---    Migraine.        ---    Eating disorder.        ---    Kidney stones.        ---    Hypokalemia.        ---    Otitis media.        ---    Acute pharyngitis.        ---    Disorder of kidney and ureter.        ---    Urinary tract infections (UTIs).        ---    Dysmenorrhea.        ---    Contact dermatitis.        ---    Shoulder joint pain.        ---    Neck pain.        ---    Backache.        ---    DSTYK.        ---    Parethesia.        ---    Edema.        ---    Dyspnea.        ---    Abdominal pain.        ---    Pyelonephritis.        ---    Right hydronephrosis.        ---    Staghorn calculus.        ---    Sepsis .        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        ---    Left MCA aneurysm.         ---    Right ophthalmic ICA aneurysm.        ---    Urinary rentention.        ---      **Surgical History**    ---      Appendectomy 1986    ---    Casearean section 1989    ---    Ectopic pregnancy surgery 2001    ---      **Family History**    ---      Mother: deceased    ---    Father: alive, diagnosed with  Unspecified heart disease    ---    2 brother(s) , 2 sister(s) . 2 son(s) , 4 daughter(s) .    ---    Mother died of liver MS in 73. Diabetes paternal grandmother.    ---      **Social History**    ---    Tobacco  history: Currently smoking Pack Year History: Started at 13 years  until 1/2 pack per year .    Caffeine: 4 cups of coffee.    Alcohol  Denies.    Illicit drugs: Never.    Lives with: 6 kids and grandchildren.  hx of opiod dependence, smoker, rare  etoh, no illicits, living at [facility].    ---      **Allergies**    ---      Toradol    ---      **Hospitalization/Major Diagnostic Procedure**    ---      Sepsis ( August-November-December- February) 2017-2018    ---    foley catheter removal/obtained piece 08/2018    ---      **Review of Systems**    ---    No urgent complaints. Complaining of right back pain baseline, Denies gross  hematuria, fever, chills, night sweats, dysuria, urethral discharge, pyuria,  UTI, N & V, abdominal pain, abnormal bleeding, easy brusing, weight loss and  skeletal pain.      **Vital Signs**    ---    Pain scale 8, Ht-in 61.50, Wt-lbs 196, BMI 36.43, BP 130/81, HR 89, BSA 1.96,  Ht-cm 156.21, Wt-kg 88.91, Wt Change 14 lb.      **Physical Examination**    ---     _Urology PE_ :    General Appearance: Obese, well-developed, well nourished, NAD, normal  secondary sexual characteristics on a wheel chair but can ambulate short  distances . Lungs: normal respirations, symmetric excursion with no accesory  muscle use. Back: vertebral column aligned, no sacral [place] or dimples, no  scoliosis/kyphosis, masses or tenderness. Abdomen: Right Kocher's  incision  well healed, no keloid formation. Remaining staples in place with erythema  around each staple, no discharges, globular abdomen soft, benign, nontender,  distended, no palpable masses, no hepato-splenomegaly, no hernias, bladder not  palpable. Extremities: no clubbing, cyanosis or pitting edema. Musculo-  Skeletal: no muscle wasting,  joint swelling or tenderness. Lymphatic: no  cervical, axillary or inguinal adenopathy. Neurologic: oriented x3, no focal  deficits.         **Assessments**    ---    1\. 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    ---      **Procedures**    ---    Remaining staples on the surgical incision site removed without any  difficulty. No bleeding and no discharges noted.      **Follow Up**    ---    RTC prn (per [name])    Electronically signed by [name] , MD on 02/12/2019 at 10:15 PM EDT    Sign off status: Completed        * * *        [facility]    [address]    [place], [place] [address]    Tel: [phone]    Fax: [phone]              * * *          Progress  Note: [name], MD 02/04/2019    ---    Note generated by eClinicalWorks EMR/PM Software ([email or web address])

## 2020-11-21 NOTE — Progress Notes (Signed)
* * *        **  Emily Ford**    --- ---    72 Y old Female, DOB: 02/17/1971    [address] [name] [place], [place] [address]    Home: [phone]    Provider: Prudy Feeler, MD        * * *    Telephone Encounter    ---    Answered by   Prudy Feeler  Date: 11/27/2017         Time: 05:10 PM    Message                      Received faxes re. abx and PICC care.   Appears I am listed as the responsible provider.   Unfortunately patient is new to me and I don't have records of data including imaging, decisionmaking process, or even the organism being treated.  In addition, nephrology is usually not an appropriate decisionmaking specialty re. infection of kidney.    At first fax, asked home care company to seek information from original prescriber; with second fax I am reaching out & left message to listed PCP (who appears to be a pulmonologist) so I can get enough information to assist.   May well be that PICC can come out and abx stop, but need more information to make this decisino.         --- ---                * * *                ---          * * *          Patient: Emily Ford DOB: 12-Jun-1971 Provider: Prudy Feeler, MD  11/27/2017    ---    Note generated by eClinicalWorks EMR/PM Software ([email or web address])

## 2020-11-21 NOTE — Progress Notes (Signed)
* * *        **  Emily Ford**    --- ---    82 Y old Female, DOB: 09-20-1970    [address] [name], [place] [address]    Home: [phone]    Provider: Prudy Feeler, MD        * * *    Telephone Encounter    ---    Answered by   Beryle Quant  Date: 11/27/2017         Time: 04:14 PM    Caller   fax in box    --- ---            Reason   Rx refill            Message                      Document attached from fax inbox.                Action Taken                      Tam,Finna  11/27/2017 4:18:24 PM > Please refill the following prescription.      SW: Not a prescription/course of treatment appropriate for me to refill or manage.  Have left msg with [facility].                     * * *                ---          * * *          Patient: Emily Ford DOB: [birth year] Provider: Prudy Feeler, MD  11/27/2017    ---    Note generated by eClinicalWorks EMR/PM Software ([email or web address])

## 2020-11-21 NOTE — Progress Notes (Signed)
* * *        Emily Ford**    --- ---    79 Y old Female, DOB: 21-Feb-1971, External MRN: [id number]    Account Number: [id number]    [address], [place], [place]    Home: [phone]    Insurance: E59 [employer] [facility] [place]    PCP: Adah Salvage, MD Referring: Adah Salvage, MD    Appointment Facility: [facility]        * * *    12/05/2016  Progress Notes: [name], MD **CHN#:** [id number]    --- ---    ---        Reason for Appointment    ---      1\. Right xanthogranulomatous pyelonephritis    ---    2\. Staghorn calculus    ---    3\. Severe hydronephrosis    ---      History of Present Illness    ---     _Adult Urology_ :    This is a 50 year old female with a complex medical history including, but not  limited to anxiety, depression, arthritis, back problem, migraine, eating  disorder, kidney stones, hypokalemia, otitis media, acute pharyngitis,  disorder of kidney and ureter, UTIs, dysmenorrhea, contact dermatitis,  shoulder joint pain, neck pain, backache, DSTYK, paresthesia, edema, dyspnea,  abdominal pain, right hydronephrosis, and staghorn calculus, who is here at  the request of Dr. Nestor Lewandowsky for Dr. Bonne Dolores opinion regarding  right xanthogranulomatous pyelonephritis.    .    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    She also has a history of right hydronephrosis.    [name]    [facility] in 03/2016 (Report Only) reviewed by [name] revealed there is  relatively decreased right renal uptake. Right kidney takes up approximately  22.3 % of the administered radiopharmaceutical and left kidney takes up  approximately 77.7 %. Evidence of right renal obstruction.    .    Emily Ford also has a history of a staghorn calculus. Patient did not bring a  disc of the images    .    She voids with good stream and good control.    .    She has no other complaints. Patient denies gross hematuria,  urethral  discharge, pyuria, dysuria, UTI, fever, chills, night sweats, weight loss and  skeletal pain.    .      Current Medications    ---    Taking     * Alprazolam 2 MG Tablet 1 tablet Orally Twice a day    ---    * Cephalexin 500 mg Tablet 2 tablet Orally every 12 hrs    ---    * Ferrous Sulfate 325 (65 Fe) MG Tablet 1 tablet Orally Once a day    ---    * Folic Acid 1 MG Tablet 1 tablet Orally Once a day    ---    * Gabapentin 400 MG Capsule 1 capsule Orally Twice a day    ---    * Milk of Magnesia 400 MG/5ML Suspension 5 ml as needed Orally Four times a day    ---    * Narcan 4 MG/0.1ML Liquid Nasally     ---    * Ocean Nasal Spray 0.65 % Solution 2 sprays in each nostril as needed Nasally every 2 hrs    ---    *  Pantoprazole Sodium 40 MG Tablet Delayed Release 1 tablet Orally Once a day    ---    * Paroxetine HCl 30 MG Tablet 1 tablet in the morning Orally Once a day    ---    * Polyethylene Glycol 3350 - Powder     ---    * Senna 8.6 MG Tablet 2 tablets at bedtime as needed Orally Once a day    ---    * Tamsulosin HCl 0.4 MG Capsule 1 capsule Orally Once a day    ---    * Trazodone HCl 50 MG Tablet 1 tablet at bedtime as needed Orally Once a day    ---    * Tums (calcium carbonate) 200 MG twice daily    ---    * Medication List reviewed and reconciled with the patient    ---      Past Medical History    ---       Anxiety.        ---    Depression.        ---    Arthritis.        ---    Back problem.        ---    Migraine.        ---    Eating disorder.        ---    Kidney stones.        ---    Hypokalemia.        ---    Otitis media.        ---    Acute pharyngitis.        ---    Disorder of kidney and ureter.        ---    Urinary tract infections (UTIs).        ---    Dysmenorrhea.        ---    Contact dermatitis.        ---    Shoulder joint pain.        ---    Neck pain.        ---    Backache.        ---    DSTYK.        ---    Parethesia.        ---    Edema.        ---    Dyspnea.        ---     Abdominal pain.        ---    Pyelonephritis.        ---    Right hydronephrosis.        ---    Staghorn calculus.        ---    Sepsis .        ---      Surgical History    ---      Appendectomy 1986    ---    Casearean section 1989    ---    Ectopic pregnancy surgery 2001    ---      Family History    ---      Mother: deceased    ---    Father: alive, diagnosed with Heart Disease    ---    2 brother(s) , 2 sister(s) . 2 son(s) , 4 daughter(s) .    ---    Mother died of scolerosis  of the liver in 1989. Diabetes paternal grandmother.    ---      Social History    ---    Tobacco  history: Currently smoking Pack Year History: Started at 13 years  until 1/2 pack per year .    Caffeine: 4 cups of coffee.    Lives with: 6 kids and grandchildren.      Allergies    ---      Toradol    ---      Hospitalization/Major Diagnostic Procedure    ---      Sepsis ( August-November-December- February) 2017-2018    ---      Review of Systems    ---     _Urology ROS_ :    Constitutional: No fevers/chills/weight loss or general weakness. Eyes: No  decreased visual acuity, loss of vision or diplopia. Integumentary: No rash or  skin lesions, No changes in hair or nail character. Lungs: no shortness of  breath, new frequent cough, no history of asthma. Cardiovascular: No chest  pain, palpitations. Gastrointestinal: No abdominal pain/nausea/vomiting/bowel  changes. Genitourinary: No urethral discharge or dysuria. Musculoskeletal: No  joint\muscle pain, decreased mobility or joint swelling. Neurological: No  headache, dizziness, seizures, light headedness, memory loss or numbness.  Psychiatry: No mood/behavioral changes, anxiety or depression. Endocrine: No  hirsutism or excessive hair loss, polyuria, polydipsia or alopecia.  Hematologic/Lymphatic: No lymphadenopathy, easy bruising or abnormal bleeding.  Allergic/Immunologic: No environmental or food allergies.          Vital Signs    ---    Pain scale 8, Ht-in 61.50, Wt-lbs 154.6, BMI  28.74, BP 135/78, HR 120, BSA  1.74, Ht-cm 156.21, Wt-kg 70.13.      Physical Examination    ---     _Urology PE_ :    General Appearance: well-developed, well nourished, NAD, normal secondary  sexual characteristics. HEENT: normocephalic, atraumatic, PERRLA, EOMI,  anicteric, no nasal discharge, sinuses non-tender, oral pharynx within normal  limits. Skin: no bruises, no petechiae, no rashes or lesions. Neck: supple, no  masses, trachea in midline, normal range of motion. Chest  normal AP diameter,  no rib tenderness. Lungs: normal respirations, symmetric excursion with no  accesory muscle use. Back: no CVAT, vertebral column aligned, no sacral [place]  or dimples, no scoliosis/kyphosis, masses or tenderness. Cardiovascular: RRR,  no murmur, pulses intact. Abdomen: Bruised abdominal wall, Right anterior  abdominal wall with previous abscess drainage sites dressings dry and clean,  Abdomen globular obese, soft, benign, non-tender, non-distended, no palpable  masses, no hepato-splenomegaly, no hernias, bladder not palpable. Extremities:  no clubbing, cyanosis or pitting edema. Musculo-Skeletal: no muscle wasting,  joint swelling or tenderness. Lymphatic: no cervical, axillary or inguinal  adenopathy. Neurologic: oriented x3, no focal deficits.    _GU Female_ :    Genitalia: Normally developed female genitalia. Pelvic exam: Deferred as per  patient request.          Assessments    ---    1\. Staghorn calculus - N20.0 (Primary), Long discussion regarding management  of Staghorn calculus    ---    2\. 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 Hydronephrosis, right - N13.30, Secondary to Staghorn calculus    ---      Treatment    ---       **1\. Staghorn calculus**    Notes: Advised to bring all discs of previous  radiographic studies from OSH  and other medical records that are missing in order to provide complete  assessment of her condition.     ---        **2\. Emphysematous pyelonephritis of right kidney**    _LAB: Urine Culture_   Urine Culture  See Below For Report     \-    --- --- --- ---    _LAB: Urine Dip POC_ 1 Blood, Negative for LE, and NIT.    Notes: Long discussion regarding plan of management.        **3\. Others**    Notes: RTC 12-19-2016 with discs of all previous radiographic studies for  coordination of care.    Due to her complex medical history as detailed above, Dr Dorothea Glassman spent 55  minutes with patient reviewing her medical history and for management,  treatment, counselling, and coordination of care for her condition. 50% of  this time was counselling the patient.      Preventive Medicine    ---      Counseling: Smoking . BMI Management .    ---      Procedure Codes    ---      1610 URO MD URINALYSIS AUTO W/O MICROSCOPY    ---      Follow Up    ---    RTC 12-19-2016.    Electronically signed by [name] , MD on 12/13/2016 at 09:33 AM EDT    Sign off status: Completed        * * *        [facility]    [address]    [place], [place] [address]    Tel: [phone]    Fax: [phone]              * * *          Patient: AURORE, REDINGER DOB: Nov 19, 1970 Progress Note: [name], MD  12/05/2016    ---    Note generated by eClinicalWorks EMR/PM Software ([email or web address])

## 2020-11-21 NOTE — Progress Notes (Signed)
* * *        **  Emily Ford**    --- ---    47 Y old Female, DOB: March 22, 1971    [address] [name] [place], [place] [address]    Home: [phone]    Provider: Prudy Feeler, MD        * * *    Telephone Encounter    ---    Answered by   Jessie Foot  Date: 11/27/2017         Time: 03:56 PM    Caller   patient    --- ---            Reason   nuclear test on 4/8            Message                      Hello,            Pt states she has a nuclear test/kidney function test in the afternoon at 1pm and would like to know if it's in the same building as her morning appt. IN addition how long will the test take as she has to schedule her ride for pick up. Best number for pt is [phone].            Thank you.                Action Taken                      Lettsome,Paulette  11/27/2017 3:10:50 PM >       Cole,Ashley  11/27/2017 3:56:07 PM > PT called back in regards to this testing please reach out to the PT as soon as possible in regards to this matter. This information needs to be relayed to the PT so that she can best set up her transportation with the [facility]. Best call back # [phone]- Thank you , Morrie Sheldon                     * * *                ---          * * *          Patient: Emily Ford DOB: 1971-08-23 Provider: Prudy Feeler, MD  11/27/2017    ---    Note generated by eClinicalWorks EMR/PM Software ([email or web address])

## 2020-11-21 NOTE — Progress Notes (Signed)
* * *        Emily Ford**    --- ---    82 Y old Female, DOB: 1971-02-14, External MRN: [id number]    Account Number: [id number]    [address], [place], [id number]    Home: [phone]    Insurance: [employer] [facility] [place]    PCP: Adah Salvage, MD Referring: Langley Adie    Appointment Facility: Nephrology        * * *    06/29/2018   **Appointment Provider:** Heloise Ochoa, PA **CHN#:** [id number]    --- ---      **Supervising Provider:** Prudy Feeler MD    ---         **Reason for Appointment**    ---       1\. 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    Patient was initially referred from [name] for evaluation of renal  function given his tentative plan for nephrectomy. She is on chronic IV  antibiotics (Cefazolin). She previously was able to get infusions at home, but  now has to either go to [place] or [facility]. She was recently admitted  at [facility] (06/06/18) for about a week for replacement of abcess drainage  tube. While admitted, she was found to have >1L of urinary rentention and has  been unable to fully empty her bladder since then. She currently has a chronic  foley. There is no concrete timeline in regards to nephrectomy of her right  kidney. She has an appointment at 1:30 today for follow-up with [name].    Patient reports chronic lumbar pain due to degenerative disc disease and  regularly visits a pain clinic for symptom management. She also reports SOB,  which she has at baseline due to COPD and asthma. She otherwise denies gross  hematuria, urethral discharge, pyuria, dysuria, fever, chills,  chest pain,  N/V/D, lower extremity swelling, or dizziness/lightheadedness.     _PAST IMAGING_ :    Split Function Renal Scan (01/2018):    IMPRESSION: Normal renal cortical imaging of the left kidney, without evidence  of perfusion abnormality.    NM Renal Scan (03/2016)    Report: right kidney 22.3% uptake and left kidney with approximately 77.7%.     _Associated Providers_ :    PCP: Dr. Pricilla Holm    Urology: recently referred to [name], prior urologist Dr. Richardean Canal.       **Current Medications**    ---    Taking     * Acetaminophen 325 MG Capsule 1 capsule as needed Orally every 4 hrs    ---    * Alprazolam 0.5 MG Tablet 1 tablet Orally PRN TID    ---    * Bisac-Evac 10 MG Suppository 1 suppository as needed Rectal Once a day    ---    * CeFAZolin Sodium-NaCl 1-0.9 GM/10ML Solution Prefilled Syringe as directed Intravenous 2g Q8H    ---    * Coumadin     ---    * Dulera 200-5 MCG/ACT Aerosol 2 puffs Inhalation Twice a day    ---    *  Fioricet 50-325-40 MG Tablet 1 tablet as needed Orally every 4 hrs    ---    * Gabapentin 400 mg Capsule 2 capsule Orally Twice a day    ---    * Lidocaine 5 % Patch 1 application to affected area as needed Externally Once a day    ---    * Narcan 4 MG/0.1ML Liquid Nasally     ---    * Omeprazole 20 MG Capsule Delayed Release 1 capsule Orally twice daily    ---    * Paroxetine HCl 20 MG Tablet 1 tablet in the morning Orally Once a day    ---    * Percocet 5-325 MG Tablet 1 tablet as needed Orally every 8 hrs PRN    ---    * Polyethylene Glycol 3350 - Powder     ---    * Pulmicort 0.5 MG/2ML Suspension 2 ml Inhalation BID    ---    * Senna 8.6 MG Tablet 2 tablets at bedtime as needed Orally Once a day    ---    * Simethicone-80 80 MG Tablet Chewable 1 tablet after meals and at bedtime as needed Orally Four times a day    ---    * Tamsulosin HCl 0.4 MG Capsule 1 capsule Orally Once a day    ---    * trazadone 50 mg 1 oral nightly for sleep    ---    * Tums (calcium  carbonate) 200 mg PRN p.o. twice daily    ---    * Valium 5 MG Tablet 1 tablet as needed Orally Q6H    ---    * Ventolin HFA     ---    * Xarelto 10 MG Tablet 1 tablet with food Orally Once a day    ---    * Zofran 4 MG Tablet as directed Orally Q6H PRN    ---    Not-Taking/PRN    * Ferrous Sulfate 325 (65 Fe) MG Tablet 1 tablet Orally Once a day    ---    * Medication List reviewed and reconciled with the patient    ---       **Past Medical History**    ---       Anxiety.        ---    Depression.        ---    Arthritis.        ---    Back problem.        ---    Migraine.        ---    Eating disorder.        ---    Kidney stones.        ---    Hypokalemia.        ---    Otitis media.        ---    Acute pharyngitis.        ---    Disorder of kidney and ureter.        ---    Urinary tract infections (UTIs).        ---    Dysmenorrhea.        ---    Contact dermatitis.        ---    Shoulder joint pain.        ---    Neck pain.        ---    Backache.        ---  DSTYK.        ---    Parethesia.        ---    Edema.        ---    Dyspnea.        ---    Abdominal pain.        ---    Pyelonephritis.        ---    Right hydronephrosis.        ---    Staghorn calculus.        ---    Sepsis .        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        ---    Left MCA aneurysm.        ---    Right ophthalmic ICA aneurysm.        ---    Urinary rentention.        ---       **Surgical History**    ---       Appendectomy 1986    ---    Casearean section 1989    ---    Ectopic pregnancy surgery 2001    ---       **Family History**    ---       Mother: deceased    ---    Father: alive, diagnosed with Heart Disease    ---    2 brother(s) , 2 sister(s) . 2 son(s) , 4 daughter(s) .    ---    Mother died of scolerosis of the liver in 1989. Diabetes paternal grandmother.    ---       **Social History**    ---    Lives with: 6 kids and grandchildren.    Alcohol    _Denies_    Illicit drugs: Never.    Caffeine: 4 cups of  coffee.    Tobacco    history: _Currently smoking Pack Year History: Started at 13 years until 1/2  pack per year_       **Allergies**    ---       Toradol    ---       **Hospitalization/Major Diagnostic Procedure**    ---       Sepsis ( August-November-December- February) 2017-2018    ---       **Review of Systems**    ---     _Nephrology_ :    CONSTITUTIONAL: Denies unintentional weight change, fatigue, fever, chills. [name]  EYES: Denies recent visual changes . HENT: Reports migraines. Denies auditory  changes, metallic taste, dysphagia. HEART: Denies chest pain, palpitations,  edema. GASTROINTESTINAL: Reports nausea and constipation. Denies vomiting,  diarrhea, abdominal pain, blood in stools. RESPIRATORY: Reports chronic SOB  (at baseline), denies cough. GENITOURINARY: Reports difficulty urinating. Has  chronic foley in place. Denies gross hematuria or pyuria. [name] MUSCULOSKELETAL:  Reports chronic lumbar pain, left sided sciatic pain with associated left leg  weakness. . INTEGUMENTARY (SKIN AND/OR BREAST): Denies rashes, non healing  wounds, change in moles, any new or abnormal lesions. ENDOCRINE: Denies  increased thirst or urination, intolerance to heat or cold.  HEMATOLOGIC/LYMPHATIC: Denies easy bruising, swollen or tender glands.  PSYCHIATRIC: Reports anxiety.[name] NEUROLOGICAL SYSTEM: Denies dizziness,  numbness, tingling, change in mental status.          **Vital Signs**    ---    Ht-in 61.50, Wt-lbs 158.8, BMI 29.52, BP 111/77, HR 105, BSA 1.77,  Ht-cm  156.21, Wt-kg 72.03, Wt Change 3.4 lb.       **Physical Examination**    ---     _NEPHROLOGY_ :    CONSTITUTIONAL / VITALS: Appears well and in no apparent distress.    HEENT: Eyes with no pallor or icterus. Oropharynx clear and oral mucous  membrane moist.    CVS: JVD not elevated. No carotid bruit. S1 and S2 normal, tachycardic and  rhythm. No murmur, rub or gallop. No lower extremity edema.    LUNGS: Clear to auscultation bilaterally. No wheezes, rales, or  rhonchi. .    ABDOMEN: Right flank with 2 tubes: nephrostomy and abscess drainage. Abdomen  distended, non-tender no CVA tenderness. Foley catheter in place.[name]    EXTREMITIES: No lower extremity edema. [name]    SKIN: No pallor, icterus, bruise or rash.    NEURO: Alert and oriented to time, place and person. No asterixis.    MSK: No joint swelling.          **Assessments**    ---    1\. LABS - See Below (Primary)    ---    2\. 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 Today's eGFR of 27ml/min/1.73m2  with creatinine of 1.01mg /dL and UACR ~0Y. 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 Based on current  regimen of chronic antibiotics, PICC line in place on Xarelto, it is our  recommendation that she move forward with nephrectomy.    ---       **Treatment**    ---       **1\. LABS**    _LAB: Albumin (ALB)_   Albumin  4.2    3.4 - 4.8 - g/dL    --- --- --- ---    _LAB: Magnesium (MG)_ Magnesium  1.7    1.6 - 2.6 - mg/dL    --- --- --- ---    _LAB: Calcium (Ca)_ Calcium (Ca)  9.6    8.5 - 10.5 - mg/dL    --- --- --- ---    _LAB: Glucose (GLU)_ Glucose  82    70 - 139 - mg/dL    --- --- --- ---    _LAB: Phosphorus (PHOS)_ Phosphorus  2.8    2.7 - 4.5 - mg/dL    --- --- --- ---    _LAB: Blood Urea Nitrogen (BUN)_ Blood Urea Nitrogen  21    6 - 24 - mg/dL    --- --- --- ---    _LAB: Albumin, random urine w/ creat (UALB)_ Albumin Random, Urine  36.8     - mg/dL    --- --- --- ---    Microalbumin Calc, Urine  1.02     \- mg/mg    --- --- --- ---    Creatinine Random, Urine  35.91     \- mg/dL    --- --- --- ---     Albumin/Creatinine Ratio, Urine  1025  H  0 - 30 - mg/g    --- --- --- ---    _LAB: Protein/Creatinine, Random Urine (UPROT)_ Protein, Random Urine  (includes Creatinine)  64  H  0 - 15 - mg/dL    --- --- --- ---  Creatinine Random, Urine  35.91     \- mg/dL    --- --- --- ---    Protein/Creatinine Ratio, Urine  1782  H  0 - 200 - mg/g    --- --- --- ---    _LAB: CBC/DIFF with PLT (CBCWD)_ WBC  7.9    4.0 - 11.0 - K/uL    --- --- --- ---    RBC  4.24    3.70 - 5.00 - M/uL    --- --- --- ---    HGB  13.5    11.0 - 15.0 - g/dL    --- --- --- ---    HCT  42.2    32.0 - 45.0 - %    --- --- --- ---    MCV  99.5  H  80.0 - 98.0 - fL    --- --- --- ---    MCH  31.8    26.0 - 34.0 - pg    --- --- --- ---    MCHC  32.0    32.0 - 36.0 - g/dL    --- --- --- ---    RDW  14.6  H  11.5 - 14.5 - %    --- --- --- ---    PLT  239    150 - 400 - K/uL    --- --- --- ---    MPV  10.1    9.1 - 11.7 - fL    --- --- --- ---    SEG NEUT  71     \- %    --- --- --- ---    LYMPH  18     \- %    --- --- --- ---    MONO  7     \- %    --- --- --- ---    EOS  3     \- %    --- --- --- ---    BASO  0     \- %    --- --- --- ---    NEUT #  5.6    1.5 - 7.5 - K/uL    --- --- --- ---    LYMPH #  1.5    1.0 - 4.0 - K/uL    --- --- --- ---    MONO #  0.5    0.2 - 0.8 - K/uL    --- --- --- ---    EOSIN #  0.2    0.0 - 0.5 - K/uL    --- --- --- ---    BASO #  0.0    0.0 - 0.2 - K/uL    --- --- --- ---    Imm Grnas  0     \- %    --- --- --- ---    NRBC  0     \- %    --- --- --- ---    Imm Grans, Abs  0.0    0.0 - 0.1 - K/uL    --- --- --- ---    NRBC, Abs  0.0    <0.0 - K/uL    --- --- --- ---    _LAB: Electrolytes (Na, K, Cl, CO2) LYTES_ ANION GAP  7    3 - 14 -    --- --- --- ---    CL  103    98 - 110 - mEq/L    --- --- --- ---    CO2  28  20 - 30 - mEq/L    --- --- --- ---    K  4.7    3.6 - 5.1 - mEq/L    --- --- --- ---    NA  138    135 - 145 - mEq/L    --- --- --- ---    _LAB: Creatinine (CR)_ Creatinine (CR)  1.01    0.57 - 1.30 - mg/dL     --- --- --- ---    _LAB: KBPC Urinalysis_ SG  1.005       --- --- --- ---    pH  6.5       --- --- --- ---    LEU  +2       --- --- --- ---    NIT  neg       --- --- --- ---    PRO  +1       --- --- --- ---    GLU  norm       --- --- --- ---    KET  neg       --- --- --- ---    UBG  norm       --- --- --- ---    BIL  neg       --- --- --- ---    BLD  +1       --- --- --- ---    _LAB: GFR, AA_ GFR, AA  75    >60 - mL/min/1.69m2    --- --- --- ---    _LAB: GFR, NAA_ GFR, NAA  65    >60 - mL/min/1.45m2    --- --- --- ---         **2\. Xanthogranulomatous pyelonephritis**    Clinical Notes: - current eGFR is representative of left kidney function.  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    - eGFR today 73ml/min/1.73m2 with creatinine of 1.01mg /dL    - UACR ~2Z, not on ACEi.    - BP <130/80. Based on prior hospital visits, and BP taken at home, BP runs  on the lower side: 100-115/60-70s.    - will trend proteinuria and BP.    - at this point, chronic infection and need for nephrectomy supersedes  proteinuria.    - Will continue to monitor.        **3\. Others**    Notes:    - chronic abx; referred to ID for evaluation of abx and for hopes that she  can receive treatment at home.    .    Clinical Notes:    **ATTENDING ATTESTATION** : I personally interviewed and examined the patient  and reviewed PA Layken Beg's note. I agree with the history, examination,  assessment and plan as she sets them forth. 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 She is a non-linear historian. As best we can tell, she continues  to be on IV antibiotics 3 times a day, though it is unclear if this is  continuous or intermittent courses. Has not been able to get in antibiotics at  home, so is living at [place] (?) and going to [place] for antibiotics.  Still has purulent drainage from her nephrostomy tube. Also needs to get  nephrostomy placed  periodically, including just a few days ago when it fell  out. She seems a bit unclear about the role of the various members of her  health care team: Thinks I am prescribing her antibiotics, and interventional  radiology is considering taking her kidney out-1 fact I am not prescribing her  antibiotics and urology is the potential service to do nephrectomy.    Her primary goal is to go home. She is unable to do so I believe because of  her IV antibiotics. She also continues to have a PICC, which ultimately will  cause an intravascular infection. She is intermittently on Xarelto for what  sounds like line-associated clots, which also could potentially be avoided if  she was no longer on antibiotics. Given absence of function of the right  kidney on split testing, I see no reason the kidney should not come out ASAP,  as the current plan is going to lead to disaster. We may refer to ID to try to  straighten out this bizarre antibiotic plan, though as her story unfolds I  worry that much of this is logistic rather medical complications. ADDENDUM: As  we gain more information, I think she may have exhausted her home antibiotic  options which is why she is getting them at [facility]. I have contacted Dr.  the longer expressed my opinion that surgery appears to the only option to get  her out of her current difficult situation. There is another urologist  involved complicating matters I believe. We also found today that she has  proteinuria, and a sample which we believe is from her Foley; unclear if this  could be drainage from her scarred kidney versus some sort of involvement of  her (so far) adequately-functioning left kidney. 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 Further comments per PA Cortana Vanderford, above..      **Procedure Codes**    ---       16109 URINALYSIS TEST PROCEDURE    ---       **Follow Up**    ---    6  Months    **Appointment Provider:** Heloise Ochoa, PA    Electronically signed by Prudy Feeler MD on 07/02/2018 at 06:03 PM EST    Sign off status: Completed        * * *        Nephrology    [address]    [address] 4th floor    [place], [place] [address]    Tel: [phone]    Fax: [phone]              * * *          Patient: Emily Ford, Emily Ford DOB: 1971/02/10 Progress Note: Heloise Ochoa,  PA 06/29/2018    ---    Note generated by eClinicalWorks EMR/PM Software ([email or web address])

## 2020-11-21 NOTE — Progress Notes (Signed)
* * *        Emily Ford**    --- ---    50 Y old Female, DOB: 1971/06/03, External MRN: [id number]    Account Number: [id number]    [address], [place], [id number]    Home: [phone]    Insurance: E59 [employer] [facility]    PCP: Adah Salvage, MD Referring: Adah Salvage, MD    Appointment Facility: Nephrology        * * *    11/10/2017   **Appointment Provider:** [place] Pelto **CHN#:** [id number]    --- ---      **Supervising Provider:** Prudy Feeler MD    ---         **Reason for Appointment**    ---       1\. NP/AC/EVAL. 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    Patient was referred from [name] for evaluation of renal function given  his tentative plan for nephrectomy. She has been noted by [name] to have  good urinary stream and good control. Patient denies gross hematuria, urethral  discharge, pyuria, dysuria, fever, and chills.    She was between the hospital and rehab starting february 2nd, recenetly  discharged.    PCP: Dr. Pricilla Holm    Urology: recently referred to [name], prior urologist Dr. Richardean Canal    NM Kidney Scan: 03/2016 (Report Only) with right kidney 22.3% uptake and left  kidney with approximately 77.7%.    Labs:    Hgb 12, Cr 0.93-0.98 (09/2017), more recently Cr 0.9.       **Current Medications**    ---    Taking     * Acetaminophen 325 MG Capsule 1 capsule as needed Orally every 4 hrs    ---    * Alprazolam 0.5 MG Tablet 1 tablet Orally PRN TID    ---    * CeFAZolin Sodium-NaCl 1-0.9 GM/10ML Solution Prefilled Syringe as directed Intravenous 2g Q8H    ---    * Coumadin , Notes: INR 2-3    ---    * Ferrous  Sulfate 325 (65 Fe) MG Tablet 1 tablet Orally Once a day    ---    * Gabapentin 400 mg Capsule 2 capsule Orally Twice a day    ---    * Lidocaine 5 % Patch 1 application to affected area as needed Externally Once a day    ---    * Narcan 4 MG/0.1ML Liquid Nasally     ---    * Omeprazole 20 MG Capsule Delayed Release 1 capsule Orally twice daily    ---    * Paroxetine HCl 20 MG Tablet 1 tablet in the morning Orally Once a day    ---    * Percocet 5-325 MG Tablet 1 tablet as needed Orally every 8 hrs PRN    ---    * Polyethylene Glycol 3350 - Powder , Notes: PRN    ---    * Pulmicort 0.5 MG/2ML Suspension 2 ml Inhalation BID    ---    * Senna 8.6 MG Tablet 2 tablets at  bedtime as needed Orally Once a day, Notes: PRN    ---    * Tamsulosin HCl 0.4 MG Capsule 1 capsule Orally Once a day    ---    * trazadone 50 mg 1 oral nightly for sleep    ---    * Tums (calcium carbonate) 200 mg PRN p.o. twice daily    ---    * Valium 5 MG Tablet 1 tablet as needed Orally Q6H    ---    * Ventolin HFA , Notes: PRN SOB    ---    * Zofran 4 MG Tablet as directed Orally Q6H PRN    ---    * Medication List reviewed and reconciled with the patient    ---       **Past Medical History**    ---       Anxiety.        ---    Depression.        ---    Arthritis.        ---    Back problem.        ---    Migraine.        ---    Eating disorder.        ---    Kidney stones.        ---    Hypokalemia.        ---    Otitis media.        ---    Acute pharyngitis.        ---    Disorder of kidney and ureter.        ---    Urinary tract infections (UTIs).        ---    Dysmenorrhea.        ---    Contact dermatitis.        ---    Shoulder joint pain.        ---    Neck pain.        ---    Backache.        ---    DSTYK.        ---    Parethesia.        ---    Edema.        ---    Dyspnea.        ---    Abdominal pain.        ---    Pyelonephritis.        ---    Right hydronephrosis.        ---    Staghorn calculus.        ---    Sepsis .        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        ---       **Surgical History**    ---       Appendectomy 1986    ---    Casearean section 1989    ---    Ectopic pregnancy surgery 2001    ---       **Family History**    ---       Mother: deceased    ---    Father: alive, diagnosed with Heart Disease    ---    2 brother(s) , 2 sister(s) . 2 son(s) , 4 daughter(s) .    ---  Mother died of scolerosis of the liver in 57. Diabetes paternal grandmother.    ---       **Social History**    ---    Tobacco    history: _Currently smoking Pack Year History: Started at 13 years until 1/2  pack per year_    Caffeine: 4 cups of coffee.    Alcohol    _Denies_    Illicit drugs: Never.    Lives with: 6 kids and grandchildren.      **Allergies**    ---       Toradol    ---       **Hospitalization/Major Diagnostic Procedure**    ---       Sepsis ( August-November-December- February) 2017-2018    ---       **Review of Systems**    ---     _Nephrology_ :    CONSTITUTIONAL: Denies unintentional weight change, fatigue, fever, chills. [name]  GASTROINTESTINAL: \+ nausea, no diarrhea. RESPIRATORY: Denies shortness of  breath, cough, + sleep apnea. GENITOURINARY: No pain or buring on urination.  MUSCULOSKELETAL: joint pain, muscle tenderness.          **Vital Signs**    ---    Ht-in 61.50, Wt-lbs 139, BMI 25.84, BP 135/90, BSA 1.65, Ht-cm 156.21, Wt-kg  63.05, Wt Change -15.6 lb.       **Past Orders**    ---     _Lab:Albumin (Order Date - 11/10/2017) (Collection Date - 11/10/2017)_    Albumin  4.3    3.4 - 4.8 - g/dL     _Lab:Magnesium (MG) (Order Date - 11/10/2017) (Collection Date - 11/10/2017)_    Magnesium  2.1    1.6 - 2.6 - mg/dL     _Lab:Calcium (Ca) (Order Date - 11/10/2017) (Collection Date - 11/10/2017)_    Calcium (Ca)  9.8    8.5 - 10.5 - mg/dL     _Lab:Glucose (GLU) (Order Date - 11/10/2017) (Collection Date - 11/10/2017)_    Glucose  94    70 - 139 - mg/dL     _Lab:Phosphorus (PHOS) (Order Date - 11/10/2017) (Collection Date  -  11/10/2017)_    Phosphorus  2.1  L  2.7 - 4.5 - mg/dL     _Lab:Blood Urea Nitrogen (BUN) (Order Date - 11/10/2017) (Collection Date -  11/10/2017)_    Blood Urea Nitrogen  9    6 - 24 - mg/dL     _Lab:CBC/NO DIF (CBCND) (Order Date - 11/10/2017) (Collection Date -  11/10/2017)_    WBC  8.6    4.0 - 11.0 - K/uL    RBC  5.15  H  3.70 - 5.00 - M/uL    HGB  14.1    11.0 - 15.0 - g/dL    HCT  54.0  H  98.1 - 45.0 - %    MCV  88.5    80.0 - 98.0 - fL    MCH  27.4    26.0 - 34.0 - pg    MCHC  30.9  L  32.0 - 36.0 - g/dL    RDW  19.1  H  47.8 - 14.5 - %    PLT  211    150 - 400 - K/uL    MPV  10.4    9.1 - 11.7 - fL    N RBC  0     \- %    N RBC #  0.0    <0.0 - K/uL  _Lab:Electrolytes (Na, K, Cl, CO2) LYTES (Order Date - 11/10/2017)  (Collection Date - 11/10/2017)_    ANION GAP  10    3 - 14 -    CL  105    98 - 110 - mEq/L    CO2  23    20 - 30 - mEq/L    K  3.9    3.6 - 5.1 - mEq/L    NA  138    135 - 145 - mEq/L     _Lab:Creatinine (CR) (Order Date - 11/10/2017) (Collection Date -  11/10/2017)_    Creatinine (CR)  0.96    0.57 - 1.30 - mg/dL     _Lab:GFR, NAA (Order Date - 11/10/2017) (Collection Date - 11/10/2017)_    GFR, NAA  69    >60 - mL/min/1.74m2       **Physical Examination**    ---    General: labile affect, comfortable    CV: RRR, no murmurs    Resp: mild scattered rhonchi    Abd: right flank with two tubes, dressing edge hanging off with mild erythema  at entry site, bags draining purpurlent material, abdomen distended, no  tenderness at CV angle and anterior abdomen    Ext: no edema.       **Assessments**    ---    1\. 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 Xanthogranulomatous pyelonephritis**    Notes: Alexi's eGFR is currently 106 and based on the prior nuclear  medicine scan, she will have a 25% reduction in her GFR to 50-55 if she  chooses to go forth with the nephroectomy. There is no contraindication to a  nephrectomy seeing as her  left kidney, to our knowledge, is otherwise healthy.  We will set her up with a nuclear medicine scan for re-assessment of split  renal function and a follow up with [name].    ---        **Labs**    --- ---    _Lab: [facility] Urinalysis_    ---       SG  1.010       --- --- --- ---    pH  6       --- --- --- ---    LEU  +2       --- --- --- ---    NIT  neg       --- --- --- ---    PRO  +2       --- --- --- ---    GLU  norm       --- --- --- ---    KET  neg       --- --- --- ---    UBG  norm       --- --- --- ---    BIL  neg       --- --- --- ---    BLD  +1       --- --- --- ---    **Appointment Provider:** Brae Gartman    Electronically signed by Prudy Feeler MD on 11/17/2017 at 08:01 PM EDT    Sign off status: Completed        * * *        Nephrology    [address]    [address]th floor    [place], [place] [address]  Tel: [phone]    Fax: [phone]              * * *          Patient: Emily Ford DOB: 1971-08-15 Progress Note: Ephraim Reichel 11/10/2017    ---    Note generated by eClinicalWorks EMR/PM Software ([email or web address])

## 2020-11-21 NOTE — Progress Notes (Signed)
* * *        Emily Ford**    --- ---    50 Y old Female, DOB: 08-14-1971, External MRN: [id number]    Account Number: [id number]    [address], [place], [id number]    Home: [phone]    Insurance: E59 [employer] [facility]    PCP: Adah Salvage, MD Referring: Prudy Feeler    Appointment Facility: Infectious Disease        * * *    07/16/2018   **Appointment Provider:** Hermine Messick, MD **CHN#:** [id number]    --- ---    ---         **Reason for Appointment**    ---       1\. NP KIDNEY STONES    ---       **History of Present Illness**    ---     _GENERAL_ :    Patient is a 50 yo woman with a pmhx of anxiety, depression, opiate  dependence. 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    She had grown GBS, proteus, and e. coli. She is on long term IV cefazolin via  picc. This was started at [facility] hospital in [place]. She has a foley in  place for retention, a R sided nephrostomy tube in place because of hydro, and  a R side percutaneous drain in place because of her abscess.    The Nephrostomy tube was changed yesterday, and is changed at [facility]. Leane Call every  two months. The abscess drain was changed yesterday as well.    She is planning on nephrectomy in the near future to address the R. sided  kidney infection, but she needs neurosurgery to weigh in on her case as she  has a cerebral aneurysm that is slowly increasing in size over time.    She brings in countless sheet of records (>50) but none of them have any  imaging over the last two months. With this it is difficult to assure she has  current source control as in adequate abscess drainage with the drain in  place. Also with her countless records she also has no culture data with her  as well, but she seems clinically stable on IV cefazolin so likely  all  isolates have been susceptible.    Patient explains that over the years when she has been off of antibiotics she  will have recurrent sepsis w/in a few weeks and be back in the hospital. with  the last two months of IV cefazolin she has remained at rehab with no fevers,  chills, or clinical decline.    She is reports at baseline she has 9-10/10 over the tube sides over her R side  but this is chronic and not new. She is enrolled at a pain clinic when  outpatient and currently in the rehab center is getting narcotics scheduled    From Urology's note here is the some of the culture data they have received    Urine Culture:    07/2016: No Growth.    08/26/2016: <=10,000 CFU/ml Mixed bacterial flora.    09/16/2016: 50,000-99,000 CFU/ml Group B Streptococcus    04/2017: >=100,000 CFU/ml Group B Streptococcus    11/2016: No growth.       **Current Medications**    ---  Taking     * Acetaminophen 325 MG Capsule 1 capsule as needed Orally every 4 hrs    ---    * Albuterol     ---    * Alprazolam 0.5 MG Tablet 1 tablet Orally PRN TID    ---    * CeFAZolin Sodium-NaCl 1-0.9 GM/10ML Solution Prefilled Syringe as directed Intravenous 2g Q8H    ---    * Centrum - Tablet as directed Orally     ---    * Coumadin     ---    * Ferrous Sulfate 325 (65 Fe) MG Tablet 1 tablet Orally Once a day    ---    * Fioricet 50-300-40 MG Capsule 1 capsule as needed Orally every 4 hrs    ---    * Flomax 0.4 MG Capsule 1 capsule Orally Once a day    ---    * Gabapentin 400 mg Capsule 2 capsule Orally Twice a day    ---    * Gas-X 80 MG Tablet Chewable 1 tablet after meals and at bedtime as needed Orally Four times a day    ---    * Hemorrhoidal-HC     ---    * Lidocaine 5 % Patch 1 application to affected area as needed Externally Once a day    ---    * Narcan 4 MG/0.1ML Liquid Nasally     ---    * Niferex - Tablet as directed Orally     ---    * Omeprazole 20 MG Capsule Delayed Release 1 capsule Orally twice daily    ---    * OXYCODONE 5 MG  TABLET     ---    * Paroxetine HCl 20 MG Tablet 1 tablet in the morning Orally Once a day    ---    * Paxil 20 MG Tablet 1 tablet in the morning Orally Once a day    ---    * Percocet 5-325 MG Tablet 1 tablet as needed Orally every 8 hrs PRN    ---    * Polyethylene Glycol 3350 - Powder     ---    * Prilosec 10 MG Packet 1 packet 30 minutes before morning meal Orally Once a day    ---    * Proventil     ---    * Pulmicort 0.5 MG/2ML Suspension 2 ml Inhalation BID    ---    * Senna 8.6 MG Tablet 2 tablets at bedtime as needed Orally Once a day    ---    * Tamsulosin HCl 0.4 MG Capsule 1 capsule Orally Once a day    ---    * trazadone 50 mg 1 oral nightly for sleep    ---    * Tums (calcium carbonate) 200 mg PRN p.o. twice daily    ---    * Valium 5 MG Tablet 1 tablet as needed Orally Q6H    ---    * Ventolin HFA     ---    * Xanax 0.5 MG Tablet 1 tablet Orally Twice a day    ---    * Zofran 4 MG Tablet as directed Orally Q6H PRN    ---       **Vital Signs**    ---    Pain scale 7, Ht-in 61.50, Wt-lbs wc, BP 122/83, HR 113, RR 16, Temp 97.1,  Oxygen sat % 94RA.       **Physical  Examination**    ---     _Infectious Disease_ :    General appears well, no distress, but is in a wheel chair.    HEENT: head atraumatic, pupils anicteric, noninjected, PERRL, EOMI, oropharynx  normal, no erythema, no thrush, no ulcerations.    Neck: supple, no lymphahadenopathy.    Lungs: clear bilaterally.    Back: over the R flank area she has two drains, one in her perinephric abscess  and the other is her urostomy tube. Patient is expresses pain over the R CVA  area but doesn't actually grimace or stop talking during the exam. The two  drains show cloudy output the urostomy tube is slight less opaque.    Heart: RRR, no murmurs, no gallops, no rubs.    Abdomen: soft, non-tender, non-distended, no HSM, bowel sounds present.    Extremities: no clubbing, no cyanosis, no edema.    Neurology: grossly nonfocal.    Dermatology: no rash, no  stigmata of endocarditis.    Psychology: patient is very animated and talks at length tangentially. .    Lymph Nodes no cervical, no axillary, no inguinal .          **Assessments**    ---    1\. 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 Perinephric abscess - N15.1    ---    3\. 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 Her staghorn calculus/proteus c/b  xanthogranulomatous pyelonephritis with perinephric abscesses s/p nephrostomy  and pigtail placement    ---      Patient is a 50 yo woman with a pmhx of recurrent UTI due to staghorn  calculous and now emphysematous pyelonephritis with perinephric abscess and  psoas abscess. She has very little residual uop from this R. sided kidney and  plan will be for nephrectomy in the near future after her cerebral aneurysms  has been re-evaluated by neurosurgery.    She is currently on IV cefazolin and is clinically stable. I presuming this is  the correct spectrum of antibiotic coverage given her clinical stability. I do  not have culture data to review.    I do want to assure that patient has adequate abscess drainage at the moment.  I sent her for renal US to determine if there were any obvious residual  pockets that were not being addressed with her current percutaneous drain. But  the renal US was poor quality given the stone created artifact and obscured  our ability to evaluated for abscess.    Instead now I have called [facility] department of IR to discuss with the MD  who did her drain and nephrostomy replacement on 07/15/18. I left my cell. She  or He could likely tell me if we have adequate drainage placement or not.    If I can determine that indeed patient's abscess are being address I recommend  stopping the IV cefazolin and going out on oral Keflex instead. Keflex get  adequate urinary penetration and soft tissue penetration and will be  sufficient.  Prolonged IV antibiotics come with risk over time that are  unnecessary in her case if we know she had adequate abscess control. She  should be on antibiotics until she had her nephrectomy. And I want to see her  to get a urine cultures just prior to surgery to sterilize the area  as best as  possible prior to instrumentation.    Will communicate this patient with Urology. Will need to see surgical planning  before I can schedule ID follow up.    ---    **Appointment Provider:** Hermine Messick, MD    Electronically signed by Shayne Alken , MD on 07/17/2018 at 10:33 AM EST    Sign off status: Completed        * * *        Infectious Disease    [address] [place], [address]    [place], [place] [address]    Tel: [phone]    Fax: [phone]              * * *          Patient: Emily Ford, Emily Ford DOB: 11/18/1970 Progress Note: Hermine Messick, MD  07/16/2018    ---    Note generated by eClinicalWorks EMR/PM Software ([email or web address])

## 2020-11-21 NOTE — Progress Notes (Signed)
* * *        **  Emily Ford**    --- ---    66 Y old Female, DOB: 1971/07/22    [address] [name] [place], [place] [address]    Home: [phone]    Provider: Prudy Feeler, MD        * * *    Telephone Encounter    ---    Answered by   Jessie Foot  Date: 11/28/2017         Time: 03:30 PM    Caller   Scott with [facility]    --- ---            Reason   IV CeFAZolin            Message                      Hello,            Pt only has does left through Sunday night, need to know if pt will continue IV CeFAZolin or should delivery be discontinued. Best number is [phone].            Thank you,                Action Taken                      [facility]  11/28/2017 3:32:54 PM >       DIBENEDETTO,CHRISTINE M, PA-C 12/01/2017 8:32:24 AM > Pennie Rushing, I really don't know why these messages keep coming to [place]. Do you know who prescribed her IV antibiotics and who is managing them? I am happy to call her back to discuss.  Maybe we can talk about it in clinic.      Thanks!      Christine            Again, I have left message with the [facility] compant stating we do not have the appropriate information, nor are we the proper service, to make this important medical decision.   Would be happy to help sort out proper contact, or to gain information so that we can help.                     * * *                ---          * * *          Patient: Emily Ford DOB: 1971-02-19 Provider: Prudy Feeler, MD  11/28/2017    ---    Note generated by eClinicalWorks EMR/PM Software ([email or web address])

## 2020-11-21 NOTE — Progress Notes (Signed)
* * *        **  Emily Ford**    --- ---    59 Y old Female, DOB: [birth year]    [address] [name] [place], [place] [address]    Home: [phone]    Provider: Prudy Feeler, MD        * * *    Telephone Encounter    ---    Answered by   Felix Pacini  Date: 12/01/2017         Time: 01:57 PM    Caller   [facility]    --- ---            Reason   call back request            Message                      Hello,      the pt clinic from [facility] called in regards to a voicemail left for them.they would like to confirm the line can be taken out of the pt .please give them a cal back at [phone].            Thank you                 Action Taken                      [place]  12/01/2017 1:59:07 PM >       DIBENEDETTO,CHRISTINE M, PA-C 12/01/2017 2:40:33 PM > Pennie Rushing, did you call?      I called repeatedly and left message stating we are not appropriate person, nor do we have the information, to help sort out this medical decision.  Would be happy to talk to someone to help sort it out.                     * * *                ---          * * *          Patient: Emily Ford DOB: [birth year] Provider: Prudy Feeler, MD  12/01/2017    ---    Note generated by eClinicalWorks EMR/PM Software ([email or web address])

## 2020-11-21 NOTE — Progress Notes (Signed)
* * *        Emily Ford**    --- ---    49 Y old Female, DOB: 07-02-1971, External MRN: [id number]    Account Number: [id number]    [address], [place], [id number]    Home: [phone]    Insurance: [id number] [employer] [facility]    PCP: Adah Salvage, MD Referring: Langley Adie    Appointment Facility: [facility]        * * *    06/29/2018  Progress Notes: [name], MD **CHN#:** [id number]    --- ---    ---         **Reason for Appointment**    ---       1\. Urinary retention    ---    2\. Right xanthogranulomatous pyelonephritis    ---    3\. Staghorn calculus    ---    4\. Severe hydronephrosis    ---    5\. Inguinal pain    ---       **History of Present Illness**    ---     _Adult Urology_ :    This is a 50 year old female with a complex medical history including, but not  limited to anxiety, depression, arthritis, back problem, migraine, eating  disorder, kidney stones, hypokalemia, otitis media, acute pharyngitis,  disorder of kidney and ureter, UTIs, dysmenorrhea, contact dermatitis,  shoulder joint pain, neck pain, backache, DSTYK, paresthesia, edema, dyspnea,  abdominal pain, right hydronephrosis, staghorn calculus, Inguinal pain and  right xanthogranulomatous pyelonephritis, who is here today for a follow-up  visit.    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 She was  recently admitted at [facility]. Leane Call (06/06/18) for about a week for replacement of  abscess drainage tube. She was started on long term abx for Cefazolin IV. She  previously was able to get infusions at home, but now has to either go to  [place] or [facility]. During her [place]. Leane Call (06/06/18) for about a week  for replacement of abscess drainage tube. While admitted, she was found to  have >1 L of urinary retention and has been unable to fully empty her bladder  since then. She currently has a chronic  Foley Catheter and [facility].    .    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    She also has a history of right hydronephrosis.    [name]    NM Kidney in 03/2016 (Report Only) reviewed by [name] revealed there is  relatively decreased right renal uptake. Right kidney takes up approximately  22.3 % of the administered radiopharmaceutical and left kidney takes up  approximately 77.7 %. Evidence of right renal obstruction.    .    [name] also has a history of a staghorn calculus.    .    Renal-Spect in 01/2018 reviewed by [name] revealed normal renal cortical  imaging of the left kidney, without evidence of perfusion abnormality.    She has a history of UTI/urosepsis but last episode in April 2018, since then  she has been on home IV antibiotics since July 108. He has been sepsis since  that time.    .    BUN, Cr, GFR:  07/2016: 20, 1.07, 68.    08/2016: 12, 1.10, 66.    10/2017: 19, 1.13, 63.    06/2018: 21, 1.01, 65.    [name]    PSA:    11/2016: 0.69.    [name]    Urine Culture:    07/2016: No Growth.    08/26/2016: <=10,000 CFU/ml Mixed bacterial flora.    09/16/2016: 50,000-99,000 CFU/ml Group B Streptococcus    04/2017: >=100,000 CFU/ml Group B Streptococcus    11/2016: No growth.    .    She voids with good stream and good control.    .    She has no other complaints. Patient denies gross hematuria, urethral  discharge, pyuria, dysuria, UTI, fever, chills, night sweats, weight loss and  skeletal pain.    [name] Assessment_ :    Is Patient a Fall Risk? : No.      **Current Medications**    ---    Taking     * Acetaminophen 325 MG Capsule 1 capsule as needed Orally every 4 hrs    ---    * Albuterol     ---    * Alprazolam 0.5 MG Tablet 1 tablet Orally PRN TID    ---    * CeFAZolin Sodium-NaCl 1-0.9 GM/10ML Solution Prefilled Syringe as directed Intravenous 2g Q8H    ---    * Centrum - Tablet as  directed Orally     ---    * Coumadin     ---    * Ferrous Sulfate 325 (65 Fe) MG Tablet 1 tablet Orally Once a day    ---    * Fioricet 50-300-40 MG Capsule 1 capsule as needed Orally every 4 hrs    ---    * Flomax 0.4 MG Capsule 1 capsule Orally Once a day    ---    * Gabapentin 400 mg Capsule 2 capsule Orally Twice a day    ---    * Gas-X 80 MG Tablet Chewable 1 tablet after meals and at bedtime as needed Orally Four times a day    ---    * Hemorrhoidal-HC     ---    * Lidocaine 5 % Patch 1 application to affected area as needed Externally Once a day    ---    * Narcan 4 MG/0.1ML Liquid Nasally     ---    * Niferex - Tablet as directed Orally     ---    * Omeprazole 20 MG Capsule Delayed Release 1 capsule Orally twice daily    ---    * OXYCODONE 5 MG TABLET     ---    * Paroxetine HCl 20 MG Tablet 1 tablet in the morning Orally Once a day    ---    * Paxil 20 MG Tablet 1 tablet in the morning Orally Once a day    ---    * Percocet 5-325 MG Tablet 1 tablet as needed Orally every 8 hrs PRN    ---    * Polyethylene Glycol 3350 - Powder     ---    * Prilosec 10 MG Packet 1 packet 30 minutes before morning meal Orally Once a day    ---    * Proventil     ---    * Pulmicort 0.5 MG/2ML Suspension 2 ml Inhalation BID    ---    * Senna 8.6 MG Tablet 2 tablets at bedtime  as needed Orally Once a day    ---    * Tamsulosin HCl 0.4 MG Capsule 1 capsule Orally Once a day    ---    * trazadone 50 mg 1 oral nightly for sleep    ---    * Tums (calcium carbonate) 200 mg PRN p.o. twice daily    ---    * Valium 5 MG Tablet 1 tablet as needed Orally Q6H    ---    * Ventolin HFA     ---    * Xanax 0.5 MG Tablet 1 tablet Orally Twice a day    ---    * Zofran 4 MG Tablet as directed Orally Q6H PRN    ---    * Medication List reviewed and reconciled with the patient    ---       **Past Medical History**    ---       Anxiety.        ---    Depression.        ---    Arthritis.        ---    Back problem.        ---    Migraine.        ---     Eating disorder.        ---    Kidney stones.        ---    Hypokalemia.        ---    Otitis media.        ---    Acute pharyngitis.        ---    Disorder of kidney and ureter.        ---    Urinary tract infections (UTIs).        ---    Dysmenorrhea.        ---    Contact dermatitis.        ---    Shoulder joint pain.        ---    Neck pain.        ---    Backache.        ---    DSTYK.        ---    Parethesia.        ---    Edema.        ---    Dyspnea.        ---    Abdominal pain.        ---    Pyelonephritis.        ---    Right hydronephrosis.        ---    Staghorn calculus.        ---    Sepsis .        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        ---    Left MCA aneurysm.        ---    Right ophthalmic ICA aneurysm.        ---    Urinary rentention.        ---    Urinary retention.        ---       **Surgical History**    ---       Appendectomy 1986    ---    Casearean section 1989    ---  Ectopic pregnancy surgery 2001    ---       **Family History**    ---       Mother: deceased    ---    Father: alive, diagnosed with Heart Disease    ---    2 brother(s) , 2 sister(s) . 2 son(s) , 4 daughter(s) .    ---    Mother died of scolerosis of the liver in 1989. Diabetes paternal grandmother.    ---       **Social History**    ---    Lives with: 6 kids and grandchildren.    Alcohol  Denies.    Illicit drugs: Never.    Caffeine: 4 cups of coffee.    Tobacco  history: Currently smoking Pack Year History: Started at 13 years  until 1/2 pack per year .      **Allergies**    ---       Toradol    ---       **Hospitalization/Major Diagnostic Procedure**    ---       Sepsis ( August-November-December- February) 2017-2018    ---       **Review of Systems**    ---     _Urology ROS_ :    Constitutional: No fevers/chills/weight loss or general weakness. Eyes: No  decreased visual acuity, loss of vision or diplopia. Integumentary: No rash or  skin lesions, No changes in hair or nail character. Lungs: no  shortness of  breath, new frequent cough, no history of asthma. Cardiovascular: No chest  pain, palpitations. Gastrointestinal: No abdominal pain/nausea/vomiting/bowel  changes. Genitourinary: chronic foley d/t retention. Has a nephorsotomy tube  and abcess drainage tube. . Musculoskeletal: No joint\muscle pain, decreased  mobility or joint swelling. Neurological: No headache, dizziness, seizures,  light headedness, memory loss or numbness. Psychiatry: No mood/behavioral  changes, anxiety or depression. Endocrine: No hirsutism or excessive hair  loss, polyuria, polydipsia or alopecia. Hematologic/Lymphatic: No  lymphadenopathy, easy bruising or abnormal bleeding. Allergic/Immunologic: No  environmental or food allergies.          **Vital Signs**    ---    Pain scale 9, Ht-in 61.50, Wt-lbs 158, BMI 29.37, BP 129/74, HR 101, BSA 1.76,  Ht-cm 156.21, Wt-kg 71.67, Wt Change -.8 lb.       **Physical Examination**    ---     _Urology PE_ :    General Appearance: well-developed, well nourished, NAD, normal secondary  sexual characteristics. HEENT: normocephalic, atraumatic, PERRLA, EOMI,  anicteric, no nasal discharge, sinuses non-tender, oral pharynx within normal  limits. Skin: no bruises, no petechiae, no rashes or lesions. Neck: supple, no  masses, trachea in midline, normal range of motion. Chest  normal AP diameter,  no rib tenderness. Lungs: normal respirations, symmetric excursion with no  accesory muscle use. Back: no CVAT, vertebral column aligned, no sacral [place]  or dimples, no scoliosis/kyphosis, masses or tenderness. Cardiovascular: RRR,  no murmur, pulses intact. Abdomen:  Has a right nephorsotomy tube and abcess  drainage tube in place.  Abdomen globular obese, soft, benign, non-tender,  non-distended, no palpable masses, no hepato-splenomegaly, no hernias, bladder  not palpable. Extremities: no clubbing, cyanosis or pitting edema. Musculo-  Skeletal: no muscle wasting,  joint swelling or tenderness.  Lymphatic: no  cervical, axillary or inguinal adenopathy. Neurologic: oriented x3, no focal  deficits.    _GU Female_ :    Genitalia: Normally developed female genitalia. Pelvic exam: Deferred as per  patient request.          **  Assessments**    ---    1\. 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 Her staghorn calculus/proteus c/b  xanthogranulomatous pyelonephritis with perinephric abscesses s/p nephrostomy  and pigtail placement    ---    3\. Hydronephrosis, right - N13.30, Secondary to Staghorn calculus    ---    4\. Inguinal pain, unspecified laterality - R10.30    ---       **Treatment**    ---       **1\. Emphysematous pyelonephritis of right kidney**    _LAB: Urine Dip POC_ 2+ Le, Negative for NIT and Blood.    Notes: Long discussion regarding plan of management, do agree he requires  nephrectomy to manage recurring UTIs and abscess. He has discussed with local  urology (Dr. Merilyn Baba), will decide how she wants to proceed. Prior to  proceeding with surgical option will need clearance from neuro with regards to  cerebral aneurysm .    ---        **2\. Others**    Notes: RTC pending his decision on how he proceeds with surgery    Due to the complexity of her condition Dr Dorothea Glassman spent 50 minutes with patient  reviewing her medical history and for management, treatment, counselling, and  coordination of care for her condition. 50% of this time was counselling the  patient. .      **Preventive Medicine**    ---       Counseling: Smoking . BMI Management .    ---      **Procedure Codes**    ---       1027 URO MD URINALYSIS AUTO W/O MICROSCOPY    ---       **Follow Up**    ---    RTC, prn    Electronically signed by [name] , MD on 07/05/2018 at 06:43 PM EST    Sign off status: Completed        * * *        [facility]    [name]    [place], [place] [address]     Tel: [phone]    Fax: [phone]              * * *          Patient: Emily Ford, Emily Ford DOB: [birth year] Progress Note: [name], MD  06/29/2018    ---    Note generated by eClinicalWorks EMR/PM Software ([email or web address])

## 2020-11-21 NOTE — Progress Notes (Signed)
* * *        **  Emily Ford**    --- ---    26 Y old Female, DOB: 10/29/1970    [address] [name] [place], [place] [address]    Home: [phone]    Provider: [name]        * * *    Telephone Encounter    ---    Answered by   Dale Durham  Date: 08/17/2018         Time: 09:50 AM    Caller   Lisabeth Pick ( [facility]NA Nurse )    --- ---            Message                      Called in stating [name] was dischargded from  her faciltiy with an indwelling Foley and had some concerns, she is a Critical care nurse and will do a VT on the patient but can you follow-up with her just in case the pt needs a follow-up with [name]. Best contact info is Lisabeth Pick [phone] ).                Action Taken                      Douglas,Carolyn A 08/17/2018 9:53:27 AM >      called patient x2 today , child answered  patient was out , I tried again later , no answer      Goldrick,Kathryn  08/21/2018 1:15:52 PM >                    * * *                ---          * * *          Patient: Emily Ford DOB: 02/04/1971 Provider: [name] 08/17/2018    ---    Note generated by eClinicalWorks EMR/PM Software ([email or web address])

## 2020-11-21 NOTE — Progress Notes (Signed)
* * *        **  [name]**    --- ---    69 Y old Female, DOB: 11/07/1970    [name] [place], [place] [address]    Home: [phone]    Provider: Prudy Feeler, MD        * * *    Telephone Encounter    ---    Answered by   [name]  Date: 07/10/2018         Time: 10:49 AM    Jethro Bolus   Tresa Endo from pt PCP office    --- ---            Reason   Fax office notes            Message                      Tresa Endo is requesting office visit notes for 06/29/18. Office fax number is [phone]. If you have any questions, office telephone is [phone]            [name]                Action Taken                      [name],[name]  07/10/2018 10:51:15 AM >                  [place] , [place] 07/10/2018 11:02:01 AM > sent                     * * *                ---          * * *          Patient: [name] DOB: 1971-03-22 Provider: Prudy Feeler, MD  07/10/2018    ---    Note generated by eClinicalWorks EMR/PM Software ([email or web address])

## 2020-11-21 NOTE — Progress Notes (Signed)
* * *        **  Emily Ford**    --- ---    60 Y old Female, DOB: 1971/05/14    [address] [name] [place], [place] [address]    Home: [phone]    Provider: Prudy Feeler, MD        * * *    Telephone Encounter    ---    Answered by   Jessie Foot  Date: 11/19/2017         Time: 01:32 PM    Caller   [name] at [facility]    --- ---            Reason   IV antibiotics            Message                      Good Afternoon,            Pt IV antibiotics last dose is tomorrow night. They need to know if pt will continue IV antibiotics delivery or will discontinue IV line. Best number for [name] is [phone].            Thank you.                      Action Taken                      Lettsome,Paulette  11/19/2017 1:34:41 PM >       DIBENEDETTO,CHRISTINE M, PA-C 11/19/2017 1:51:21 PM > Patient is not yet under my care,  forwarding message to attending, Dr. Delford Field                    * * *                ---          * * *          Patient: Emily Ford DOB: May 25, 1971 Provider: Prudy Feeler, MD  11/19/2017    ---    Note generated by eClinicalWorks EMR/PM Software ([email or web address])

## 2020-11-21 NOTE — Progress Notes (Signed)
* * *        Emily Ford**    --- ---    15 Y old Female, DOB: 1971-01-26, External MRN: [id number]    Account Number: [id number]    [address], [place], [id number]    Home: [phone]    Insurance: E59 [employer] [facility]    PCP: Emily Salvage, MD Referring: Emily Ford    Appointment Facility: [facility]        * * *    01/26/2018  Progress Notes: Emily Salvo, MD **CHN#:** [id number]    --- ---    ---         **Reason for Appointment**    ---       1\. Right xanthogranulomatous pyelonephritis    ---    2\. Staghorn calculus    ---    3\. Severe hydronephrosis    ---    4\. Inguinal pain    ---       **History of Present Illness**    ---     _Adult Urology_ :    This is a 50 year old female with a complex medical history including, but not  limited to anxiety, depression, arthritis, back problem, migraine, eating  disorder, kidney stones, hypokalemia, otitis media, acute pharyngitis,  disorder of kidney and ureter, UTIs, dysmenorrhea, contact dermatitis,  shoulder joint pain, neck pain, backache, DSTYK, paresthesia, edema, dyspnea,  abdominal pain, right hydronephrosis, staghorn calculus, Inguinal pain and  right xanthogranulomatous pyelonephritis, who is here today for a follow-up  visit.    .    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    She also has a history of right hydronephrosis.    [name]    NM Kidney in 03/2016 (Report Only) reviewed by Emily Ford revealed there is  relatively decreased right renal uptake. Right kidney takes up approximately  22.3 % of the administered radiopharmaceutical and left kidney takes up  approximately 77.7 %. Evidence of right renal obstruction.    .    Emily Ford also has a history of a staghorn calculus.    .    Renal-Spect in 01/2018 reviewed by Emily Ford revealed normal renal cortical  imaging of the left kidney, without evidence of perfusion abnormality.    She has a  history of UTI/urosepsis but last epsisode in April 2018, since then  she has been on home IV antibiotics since July 108. He has been sepsis since  that time.    .    BUN, Cr, GFR:    07/2016: 20, 1.07, 68.    08/2016: 12, 1.10, 66.    10/2017: 19, 1.13, 63.    [name]    PSA:    11/2016: 0.69.    [name]    Urine Culture:    07/2016: No Growth.    08/26/2016: <=10,000 CFU/mL Mixed bacterial flora.    09/16/2016: 50,000-99,000 CFU/mLGroup B Streptococcus    04/2017: >=100,000 CFU/mLGroup B Streptococcus    .    She voids with good stream and good control.    .    She has no other complaints. Patient denies gross hematuria, urethral  discharge, pyuria, dysuria, UTI, fever, chills, night sweats, weight loss and  skeletal pain.    .       **Current Medications**    ---    Taking     * CeFAZolin Sodium-NaCl 1-0.9  GM/10ML Solution Prefilled Syringe as directed Intravenous 2g Q8H    ---    * Coumadin , Notes: INR 2-3    ---    * Gabapentin 400 mg Capsule 2 capsule Orally Twice a day    ---    * Lidocaine 5 % Patch 1 application to affected area as needed Externally Once a day    ---    * Omeprazole 20 MG Capsule Delayed Release 1 capsule Orally twice daily    ---    * Paroxetine HCl 20 MG Tablet 1 tablet in the morning Orally Once a day    ---    * Percocet 5-325 MG Tablet 1 tablet as needed Orally every 8 hrs PRN    ---    * Polyethylene Glycol 3350 - Powder , Notes: PRN    ---    * Pulmicort 0.5 MG/2ML Suspension 2 ml Inhalation BID    ---    * Senna 8.6 MG Tablet 2 tablets at bedtime as needed Orally Once a day, Notes: PRN    ---    * Tamsulosin HCl 0.4 MG Capsule 1 capsule Orally Once a day    ---    * trazadone 50 mg 1 oral nightly for sleep    ---    * Tums (calcium carbonate) 200 mg PRN p.o. twice daily    ---    * Ventolin HFA , Notes: PRN SOB    ---    * Zofran 4 MG Tablet as directed Orally Q6H PRN    ---    Not-Taking/PRN    * Acetaminophen 325 MG Capsule 1 capsule as needed Orally every 4 hrs    ---    * Alprazolam 0.5 MG  Tablet 1 tablet Orally PRN TID    ---    * Ferrous Sulfate 325 (65 Fe) MG Tablet 1 tablet Orally Once a day    ---    * Narcan 4 MG/0.1ML Liquid Nasally     ---    * Valium 5 MG Tablet 1 tablet as needed Orally Q6H    ---    * Medication List reviewed and reconciled with the patient    ---       **Past Medical History**    ---       Anxiety.        ---    Depression.        ---    Arthritis.        ---    Back problem.        ---    Migraine.        ---    Eating disorder.        ---    Kidney stones.        ---    Hypokalemia.        ---    Otitis media.        ---    Acute pharyngitis.        ---    Disorder of kidney and ureter.        ---    Urinary tract infections (UTIs).        ---    Dysmenorrhea.        ---    Contact dermatitis.        ---    Shoulder joint pain.        ---    Neck pain.        ---  Backache.        ---    DSTYK.        ---    Parethesia.        ---    Edema.        ---    Dyspnea.        ---    Abdominal pain.        ---    Pyelonephritis.        ---    Right hydronephrosis.        ---    Staghorn calculus.        ---    Sepsis .        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        ---       **Surgical History**    ---       Appendectomy 1986    ---    Casearean section 1989    ---    Ectopic pregnancy surgery 2001    ---       **Family History**    ---       Mother: deceased    ---    Father: alive, diagnosed with Heart Disease    ---    2 brother(s) , 2 sister(s) . 2 son(s) , 4 daughter(s) .    ---    Mother died of scolerosis of the liver in 1989. Diabetes paternal grandmother.    ---       **Social History**    ---    Lives with: 6 kids and grandchildren.    Alcohol  Denies.    Illicit drugs: Never.    Caffeine: 4 cups of coffee.    Tobacco  history: Currently smoking Pack Year History: Started at 13 years  until 1/2 pack per year .      **Allergies**    ---       Toradol    ---       **Hospitalization/Major Diagnostic Procedure**    ---       Sepsis (  August-November-December- February) 2017-2018    ---       **Review of Systems**    ---     _Urology ROS_ :    Constitutional: No fevers/chills/weight loss or general weakness. Eyes: No  decreased visual acuity, loss of vision or diplopia. Integumentary: No rash or  skin lesions, No changes in hair or nail character. Lungs: no shortness of  breath, new frequent cough, no history of asthma. Cardiovascular: No chest  pain, palpitations. Gastrointestinal: No abdominal pain/nausea/vomiting/bowel  changes. Genitourinary: No urethral discharge or dysuria. Musculoskeletal: No  joint\muscle pain, decreased mobility or joint swelling. Neurological: No  headache, dizziness, seizures, light headedness, memory loss or numbness.  Psychiatry: No mood/behavioral changes, anxiety or depression. Endocrine: No  hirsutism or excessive hair loss, polyuria, polydipsia or alopecia.  Hematologic/Lymphatic: No lymphadenopathy, easy bruising or abnormal bleeding.  Allergic/Immunologic: No environmental or food allergies.          **Vital Signs**    ---    Pain scale 8, Ht-in 61.50, Wt-lbs 155.4, BMI 28.88, BP 115/77.       **Physical Examination**    ---     _Urology PE_ :    General Appearance: well-developed, well nourished, NAD, normal secondary  sexual characteristics. HEENT: normocephalic, atraumatic, PERRLA, EOMI,  anicteric, no nasal discharge, sinuses non-tender, oral pharynx within normal  limits. Skin: no bruises, no petechiae,  no rashes or lesions. Neck: supple, no  masses, trachea in midline, normal range of motion. Chest  normal AP diameter,  no rib tenderness. Lungs: normal respirations, symmetric excursion with no  accesory muscle use. Back: no CVAT, vertebral column aligned, no sacral   or dimples, no scoliosis/kyphosis, masses or tenderness. Cardiovascular: RRR,  no murmur, pulses intact. Abdomen: Bruised abdominal wall, Right anterior  abdominal wall with previous abscess drainage sites dressings dry and  clean,  Abdomen globular obese, soft, benign, non-tender, non-distended, no palpable  masses, no hepato-splenomegaly, no hernias, bladder not palpable. Extremities:  no clubbing, cyanosis or pitting edema. Musculo-Skeletal: no muscle wasting,  joint swelling or tenderness. Lymphatic: no cervical, axillary or inguinal  adenopathy. Neurologic: oriented x3, no focal deficits.    _GU Female_ :    Genitalia: Normally developed female genitalia. Pelvic exam: Deferred as per  patient request.          **Assessments**    ---    1\. Staghorn calculus - N20.0 (Primary), Long discussion regarding management  of Staghorn calculus    ---    2\. 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 Hydronephrosis, right - N13.30, Secondary to Staghorn calculus    ---    4\. Inguinal pain, unspecified laterality - R10.30    ---       **Treatment**    ---       **1\. Staghorn calculus**    Notes:  request recent medical records    .    ---        **2\. Emphysematous pyelonephritis of right kidney**    _LAB: Urine Culture (CXURN)_    _LAB: Urine Dip POC_ Negative for LE, NIT, and Blood    Notes:  Long discussion regarding plan of management    will plan to follow up with local urology (Dr. Merilyn Baba) to discuss plan for  surgery.        **3\. Others**    Notes: RTC PRN    Due to the complexity of his condition Dr Dorothea Ford spent 50 minutes with patient  reviewing his medical history and for management, treatment, counselling, and  coordination of care for his condition. 50% of this time was counselling the  patient.      **Preventive Medicine**    ---       Counseling: Smoking . BMI Management .    ---      **Procedure Codes**    ---       7532 URO MD URINALYSIS AUTO W/O MICROSCOPY    ---       **Follow Up**    ---    RTC with discs of all previous radiographic studies for coordination of care.    Electronically signed by Emily Ford , MD on 03/27/2018 at 06:03 PM EDT     Sign off status: Completed        * * *        [facility]    [address]    [place], [place] [address]    Tel: [phone]    Fax: [phone]              * * *          Patient: Emily Ford, Emily Ford DOB: 05-22-1971 Progress Note: Emily Salvo, MD  01/26/2018    ---    Note generated  by eClinicalWorks EMR/PM Software ([email or web address])

## 2021-03-05 IMAGING — MR PE ESQUERDO.
7 of 10 series · 24 of 40 positions shown · non-contrast
Comparison: none

[Series 1: loc 3 planos · axial · 7.0mm · 1.25mm/px · z∈[-36,+159]mm · 4 of 27 slices shown (1 of 2)]
[im 1/27]
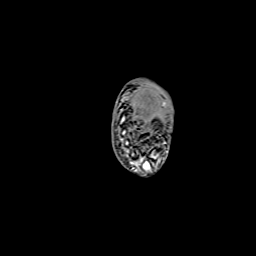
[im 9/27]
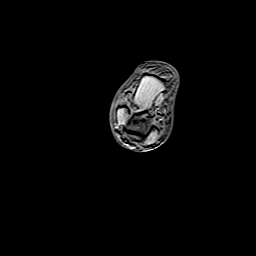
[im 18/27]
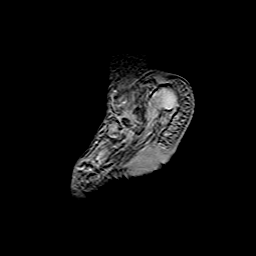
[im 27/27]
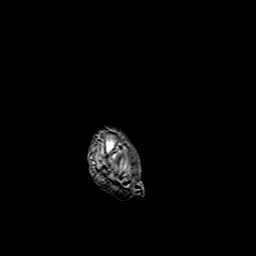

[Series 2: loc 3 planos · axial · 7.0mm · 1.25mm/px · z∈[-32,+163]mm · 4 of 27 slices shown (2 of 2)]
[im 1/27]
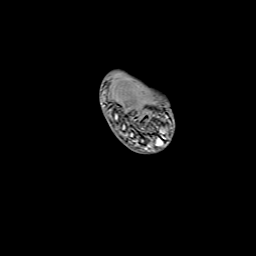
[im 9/27]
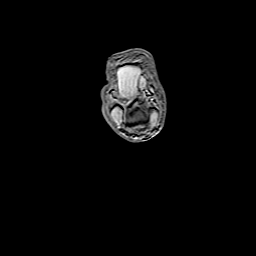
[im 18/27]
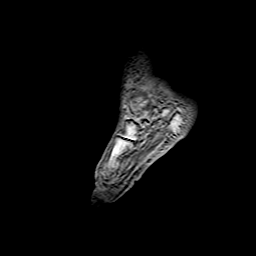
[im 27/27]
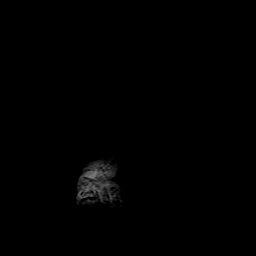

[Series 3: T2 · sagittal · 9.0mm · 0.51mm/px · 2 of 10 slices shown (1 of 3)]
[im 1/10]
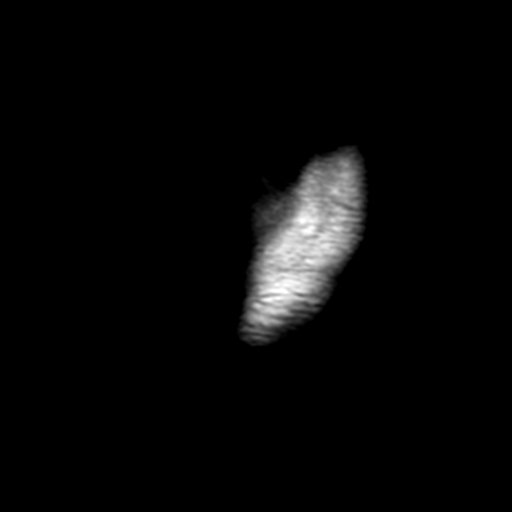
[im 10/10]
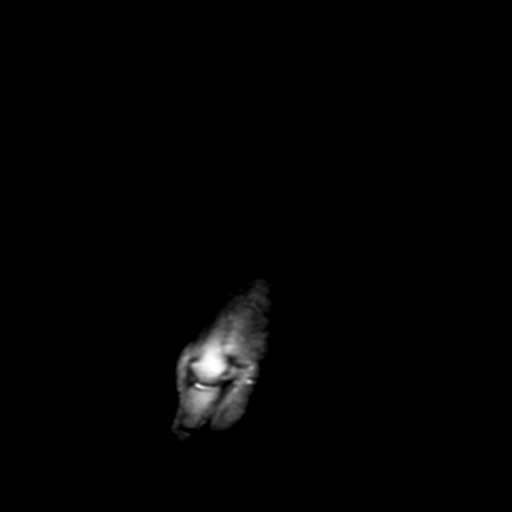

[Series 4: T2 · oblique · 9.0mm · 0.51mm/px · 2 of 10 slices shown (2 of 3)]
[im 1/10]
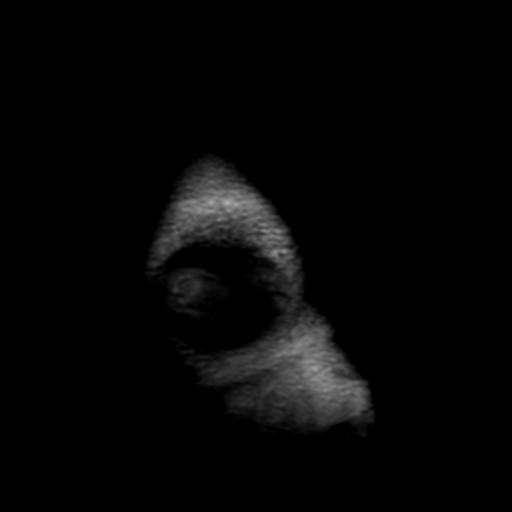
[im 10/10]
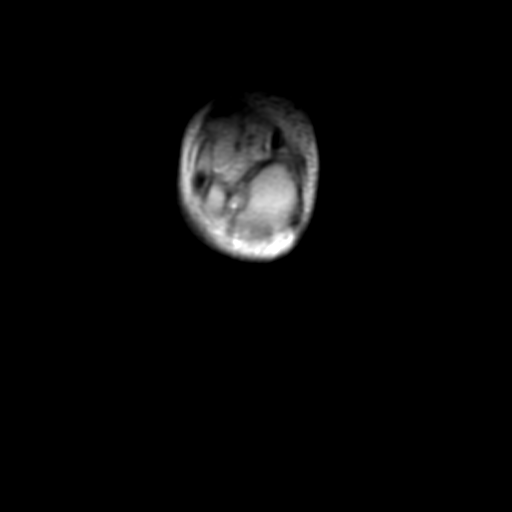

[Series 5: T2 · oblique · 9.0mm · 0.51mm/px · 1 of 16 slices shown (3 of 3)]
[im 1/16]
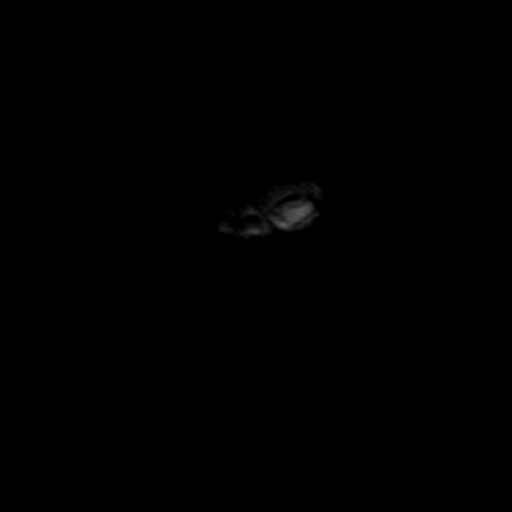

[Series 7: T1 · oblique · 3.0mm · 0.43mm/px · 5 of 28 slices shown (1 of 2)]
[im 1/28]
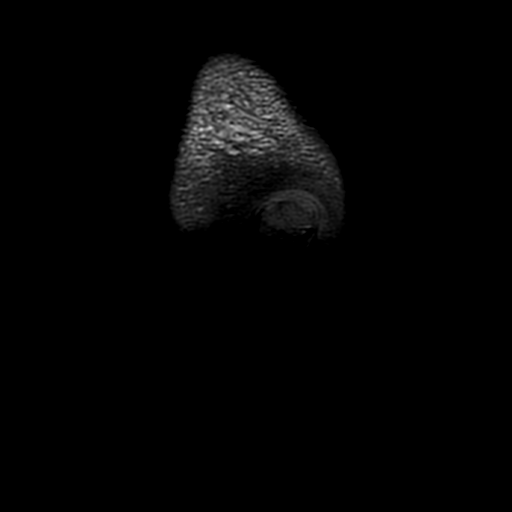
[im 7/28]
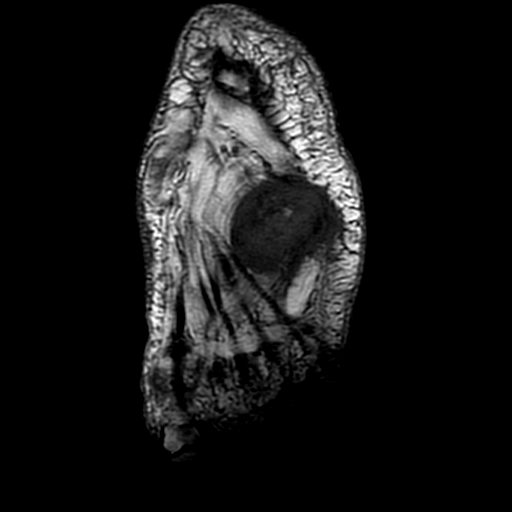
[im 14/28]
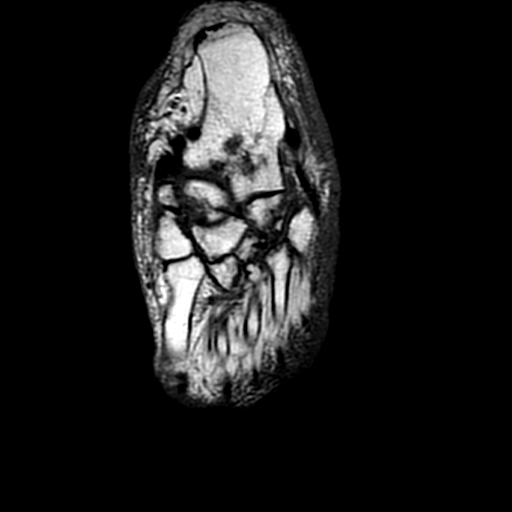
[im 21/28]
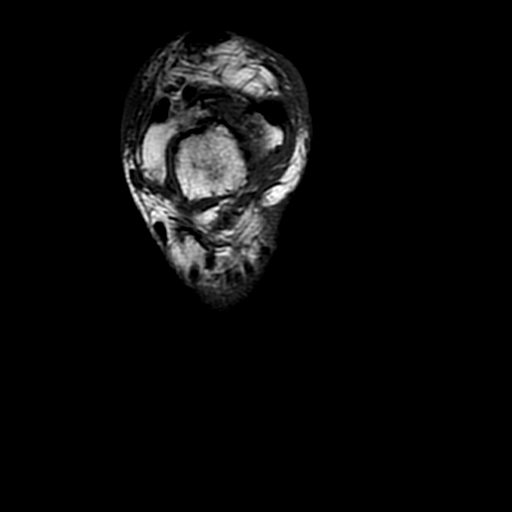
[im 28/28]
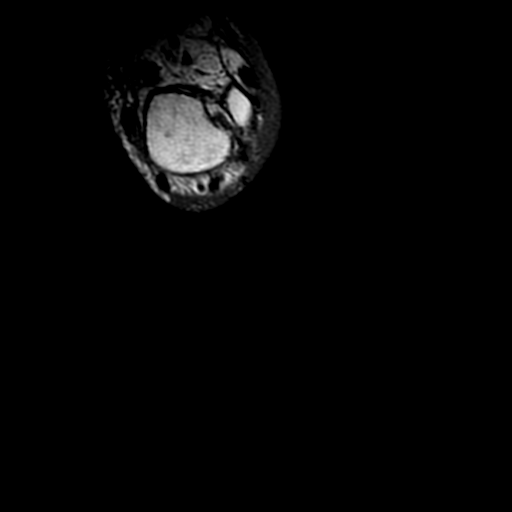

[Series 9: T1 · oblique · 4.0mm · 0.39mm/px · 6 of 40 slices shown (2 of 2)]
[im 1/40]
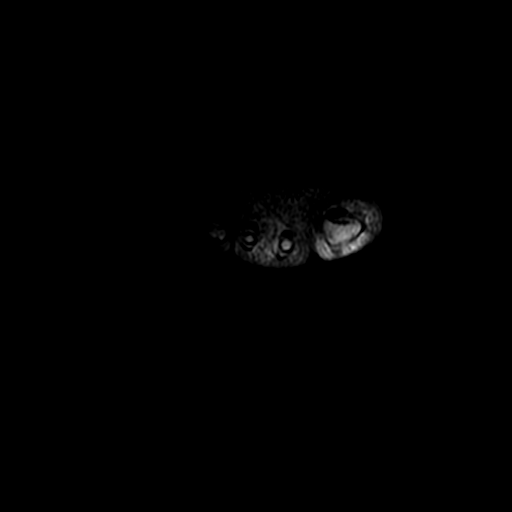
[im 8/40]
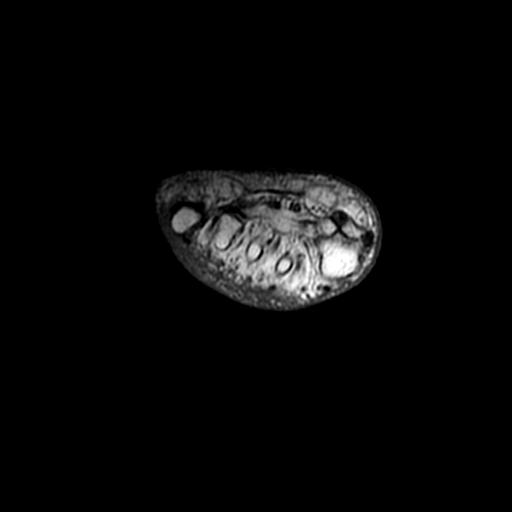
[im 16/40]
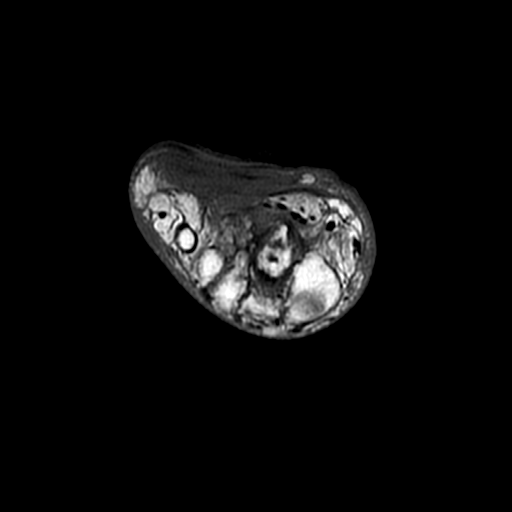
[im 24/40]
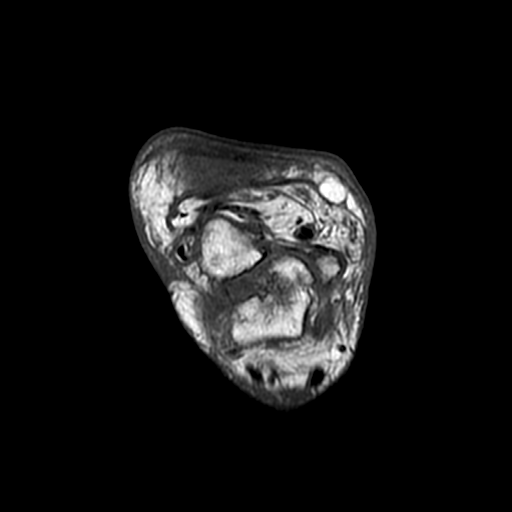
[im 32/40]
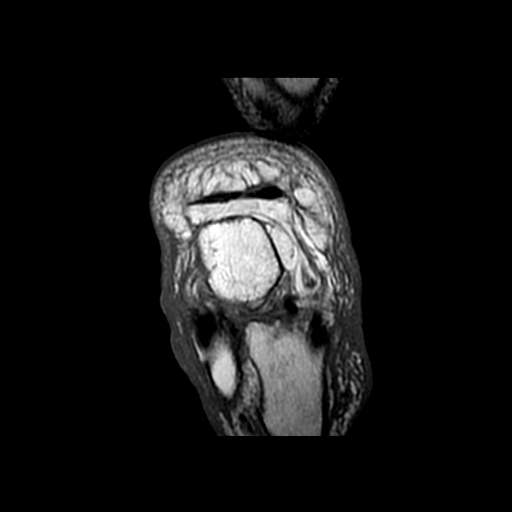
[im 40/40]
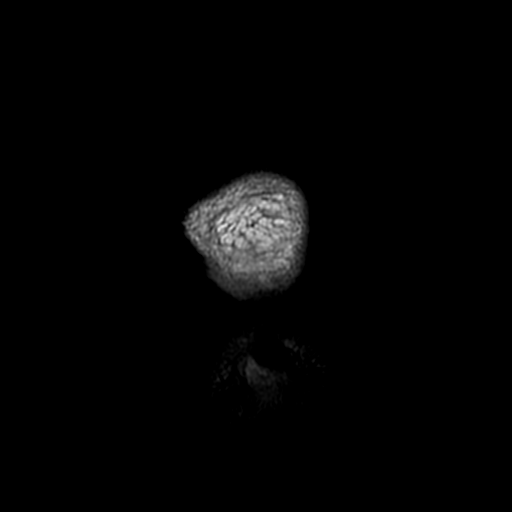

[24 of 40 positions shown; findings below may reference images not displayed]

TÉCNICA: 
Sequências axial e coronal "Fast spin-eco" ponderadas em T1.
RESULTADO:
RESSONÂNCIA MAGNÉTICA DO PÉ ESQUERDO
Sequências sagital e coronal "Fast spin-eco" ponderadas em T2, com saturação de 
gordura.
Obs.:  Exame  realizado  sem  contraste    devido  histórico  de  insuficiência  renal 
crônica.
Colapso  do  arco  plantar  notando  orientação  caudal  do 
tálus,  com 
deformidade/fratura antiga do navicular com fragmento medial deslocado, notando 
subluxações  talonavicular,  navicularcuneiformes  e  tarsometatarsianas  sobretudo 
do 2° ao 5° pododáctilos,que apresentam irregularidades corticais e deformidades 
ósseas,  bem  como  alteração  difusa  na  intensidade  de  sinal  nos  cuboides, 
cuneiformes e intermédio lateral e esparsas no tálus, caracterizada por hipossinal 
T1  e  hipersinal  em  T2,  além  de  coleção  nodulariforme  mal  definida  nas  partes 
moles da região plantar lateral do retropé, caracterizada por hipersinal heterogêneo 
em T1 e T2, medindo 4,9 x 4,2 x 2,3 cm (AP x T x L), comprometendo os planos 
musculares profundo da região plantar e determinando erosão/destruição óssea na 
cortical  plantar  do  cuboide,  com  alteração  na  intensidade  de  sinal  na  medular 
óssea,  além  de  fistulização  para  a  pele,  compatível  com  a  indicação  clínica 
proposta  de  processo  inflamatório/infeccioso  (osteomielite)  em  atividade  em 
paciente em pé de charcot.
Esporões calcâneo dorsal e calcâneo plantar.
Alterações degenerativas nas articulações tibiotalar e subtalar posterior associadas 
a derrame intra-articular com sinais de sinovite.
Sinais de tenossinovite dos flexores dos dedos e do hálux e fibulares.
Tendinopatia na inserção do calcâneo caracterizada por alteração na intensidade 
de sinal intrassubstancial associada a discreta bursite retrocalcanea.
JPS/fas
Estruturas ligamentares de avaliação prejudicada.

Redução  volumétrica  com  sinais  de  lipossubstituição  nos  grupos  musculares 
visualizados, possivelmente relacionados à doença de base.
Edema da tela subcutânea lateral do retro e mediopés.
/laboratoriais.

## 2021-12-12 IMAGING — MR CRANIO SEM SEDACAO
9 of 14 series · 18 of 48 positions shown · non-contrast
Comparison: none

[Series 2: FLAIR · sagittal · 5.5mm · 0.47mm/px · 2 of 23 slices shown]
[im 1/23]
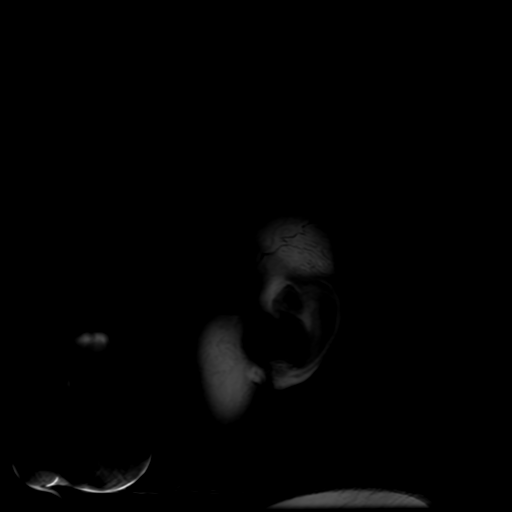
[im 23/23]
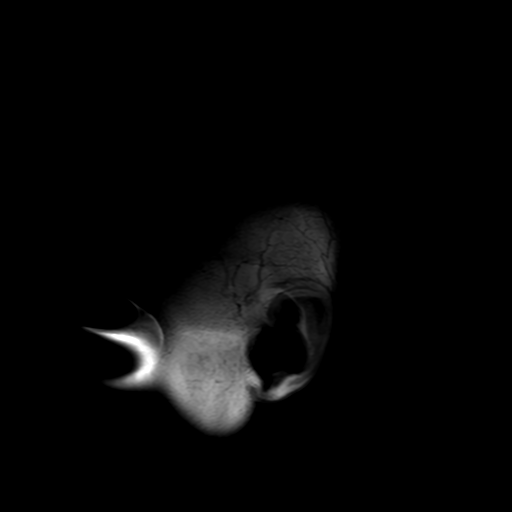

[Series 3: FLAIR fat-sat · axial · 5.5mm · 0.47mm/px · z∈[-118,+43]mm · 2 of 24 slices shown]
[im 1/24]
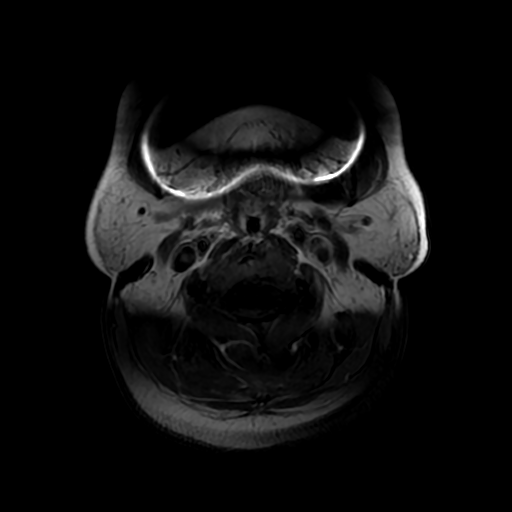
[im 24/24]
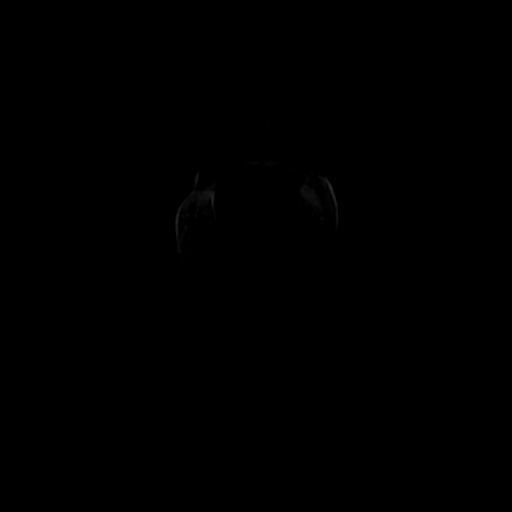

[Series 4: DWI · axial · 3.0mm · 0.94mm/px · z∈[-98,+42]mm · 5 of 87 slices shown (1 of 2)]
[im 1/87]
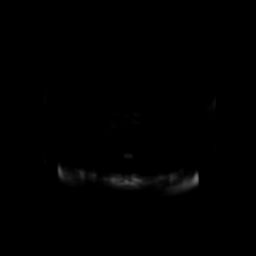
[im 22/87]
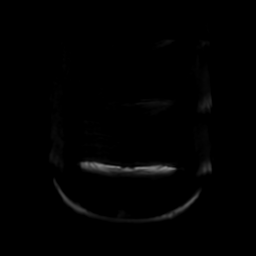
[im 44/87]
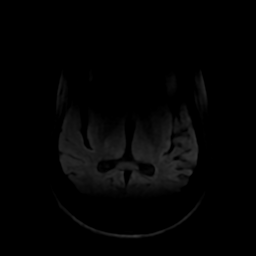
[im 65/87]
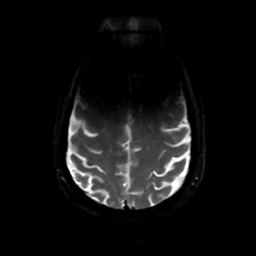
[im 87/87]
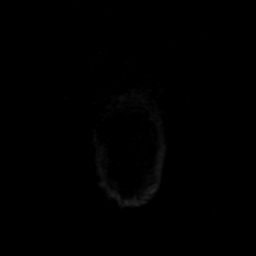

[Series 6: T2 · axial · 5.5mm · 0.23mm/px · 1 of 24 slices shown (1 of 3)]
[im 1/24]
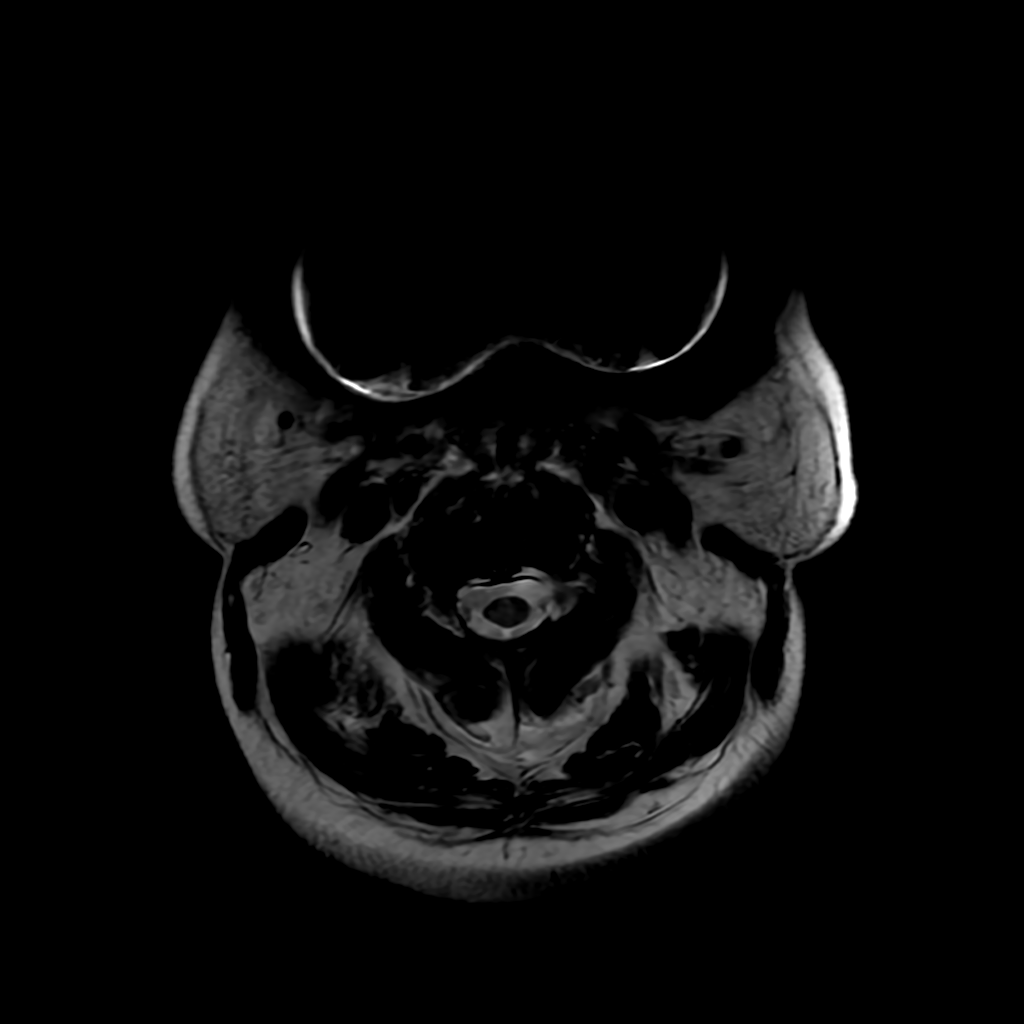

[Series 7: T2 · coronal · 5.5mm · 0.21mm/px · 1 of 25 slices shown (2 of 3)]
[im 1/25]
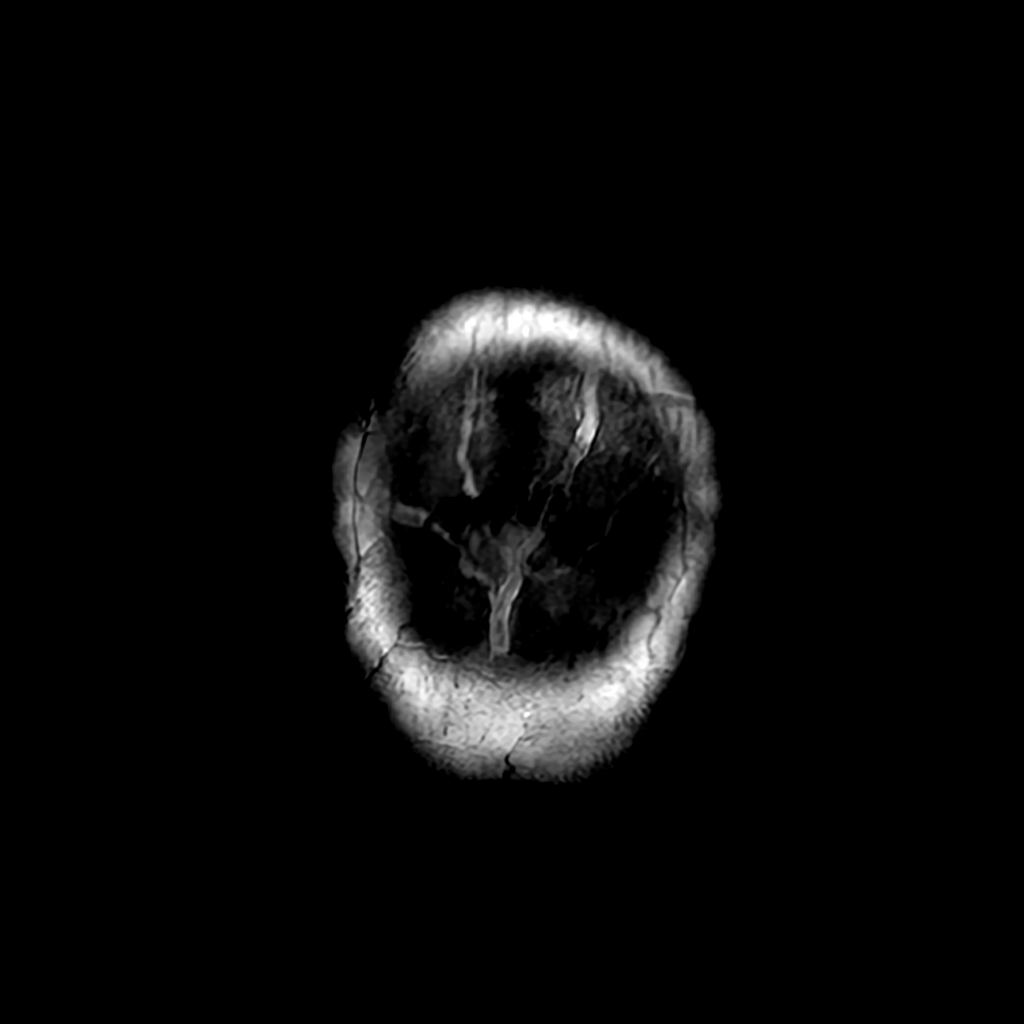

[Series 9: T2 · coronal · 5.5mm · 0.21mm/px · 1 of 25 slices shown (3 of 3)]
[im 1/25]
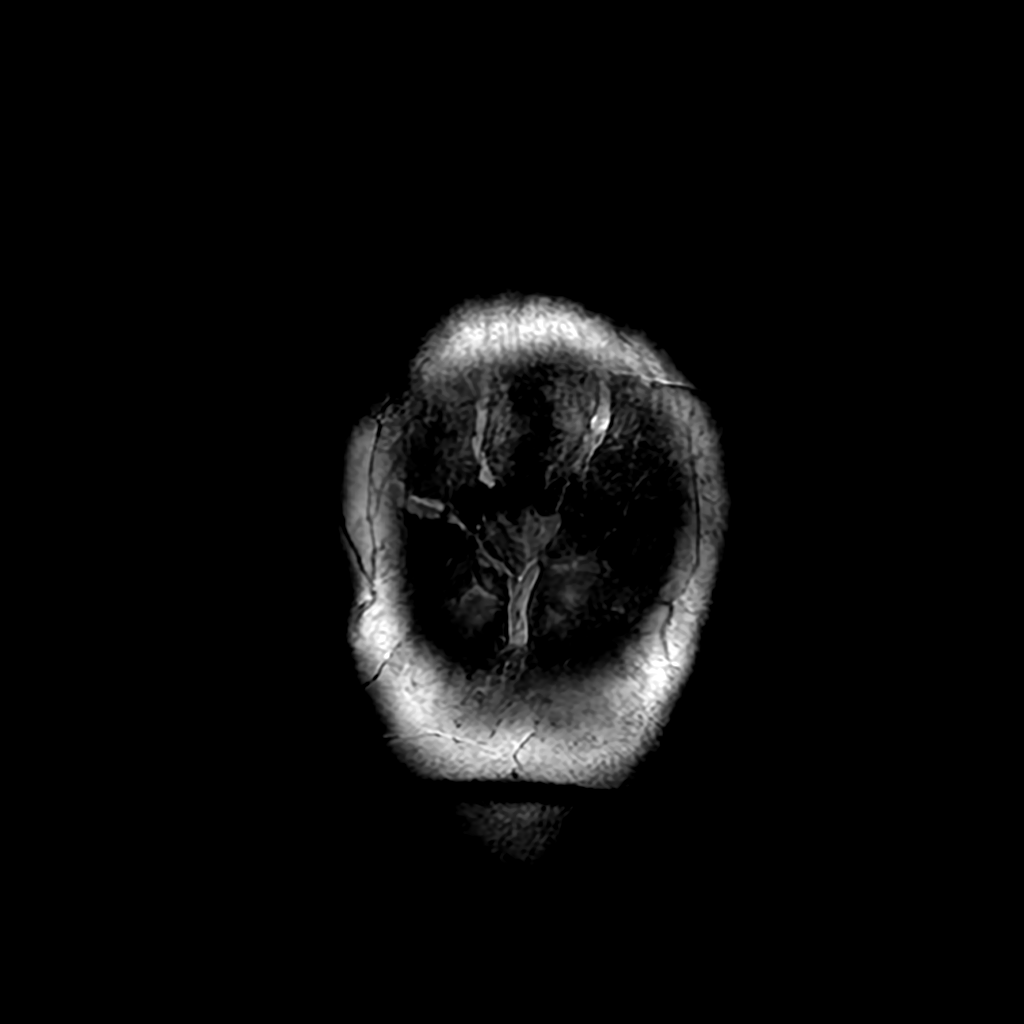

[Series 400: DWI · axial · 3.0mm · 0.94mm/px · z∈[-98,+42]mm · 2 of 42 slices shown (2 of 2)]
[im 1/42]
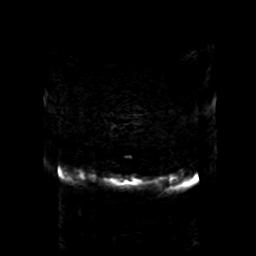
[im 42/42]
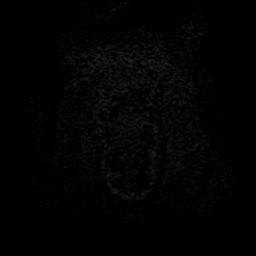

[Series 450: ADC · axial · 3.0mm · 0.94mm/px · z∈[-98,+42]mm · 3 of 44 slices shown]
[im 1/44]
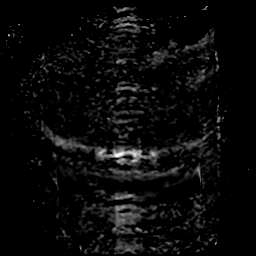
[im 22/44]
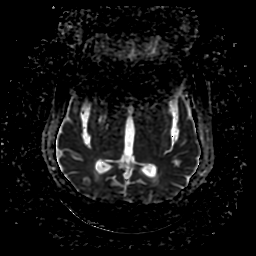
[im 44/44]
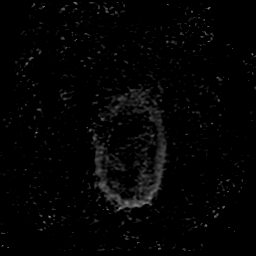

[Series 501: SWI · axial · 11.0mm · 0.47mm/px · 1 of 139 slices shown]
[im 1/139]
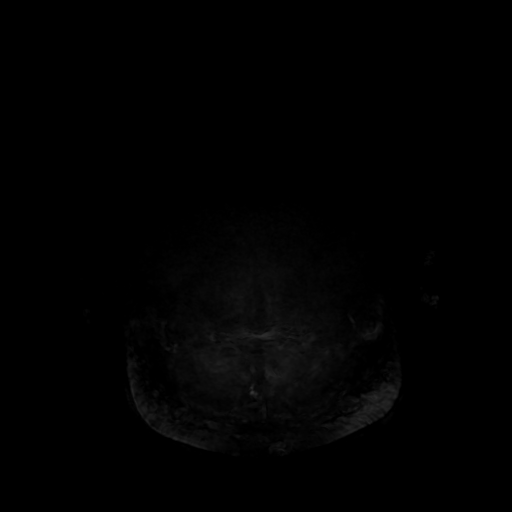

[18 of 48 positions shown; findings below may reference images not displayed]

INFORMAÇÕES CLÍNICAS: investigação de AVC.

TÉCNICA DE EXAME: exame realizado nos planos axial, coronal e sagital nas ponderações T1, T2, FLAIR, 
difusão e Gradiente-Echo, sem a infusão endovenosa de gadolínio.
Artefatos  de  susceptibilidade  magnética  decorrentes  de  aparelho  ortodôntico  prejudicaram  a  análise  de 
algumas estruturas.

INTERPRETAÇÃO:
Não há evidências de lesão expansiva, sangramento intracraniano ou coleções extra-axiais.
RESSONÂNCIA MAGNÉTICA CRÂNIO-ENCEFÁLICA
Ausência de desvio das estruturas da linha média.
Intensidade  de  sinal  das  substâncias  branca  e  cinzenta  dos  hemisférios  cerebrais  sem  alterações 
significativas.
Não  há  lesão  isquêmica  aguda  nas  imagens  ponderadas  na  difusão,  nos  segmentos  acessíveis  do 
parênquima encefálico, salientando os artefatos acima pormenorizados.
Tronco cerebral e cerebelo com intensidade de sinal preservada nas ponderações T1 e T2.
Corpo caloso com espessura e intensidade de sinal usual.
Hipocampos simétricos e com intensidade de sinal preservada.
Núcleos da base e tálamos sem indícios de alterações.
Cisternas dos ângulos ponto-cerebelares simétricas.
Ventrículos com forma e dimensões normais.
Fluxo presente nos sifões carotídeos, artéria basilar e segmentos intracranianos das artérias vertebrais.
Transição crânio-cervical anatômica.

IMPRESSÃO DIAGNÓSTICA:
O  estudo  por  ressonância  magnética  crânio-encefálica  não  demonstra  alterações  significativas  nos 
compartimentos supra e infratentoriais, salientando os artefatos acima pormenorizados.
